# Patient Record
Sex: Female | Born: 1975 | Race: Black or African American | Hispanic: No | Marital: Single | Smoking: Never smoker
Health system: Southern US, Community
[De-identification: ages and names within clinical notes are randomized; demographics above are authoritative.]

## PROBLEM LIST (undated history)

## (undated) DIAGNOSIS — G43909 Migraine, unspecified, not intractable, without status migrainosus: Secondary | ICD-10-CM

## (undated) DIAGNOSIS — I10 Essential (primary) hypertension: Secondary | ICD-10-CM

## (undated) HISTORY — PX: TUBAL LIGATION: SHX77

---

## 2008-11-24 ENCOUNTER — Emergency Department (HOSPITAL_BASED_OUTPATIENT_CLINIC_OR_DEPARTMENT_OTHER): Admission: EM | Admit: 2008-11-24 | Discharge: 2008-11-24 | Payer: Self-pay | Admitting: Emergency Medicine

## 2008-11-24 ENCOUNTER — Ambulatory Visit: Payer: Self-pay | Admitting: Diagnostic Radiology

## 2012-04-04 ENCOUNTER — Emergency Department (HOSPITAL_BASED_OUTPATIENT_CLINIC_OR_DEPARTMENT_OTHER)
Admission: EM | Admit: 2012-04-04 | Discharge: 2012-04-04 | Disposition: A | Discharge status: Home / Self Care | Payer: Self-pay | Attending: Emergency Medicine | Admitting: Emergency Medicine

## 2012-04-04 ENCOUNTER — Other Ambulatory Visit: Payer: Self-pay

## 2012-04-04 ENCOUNTER — Encounter (HOSPITAL_BASED_OUTPATIENT_CLINIC_OR_DEPARTMENT_OTHER): Payer: Self-pay | Admitting: *Deleted

## 2012-04-04 ENCOUNTER — Emergency Department (HOSPITAL_BASED_OUTPATIENT_CLINIC_OR_DEPARTMENT_OTHER): Payer: Self-pay

## 2012-04-04 DIAGNOSIS — I10 Essential (primary) hypertension: Secondary | ICD-10-CM | POA: Insufficient documentation

## 2012-04-04 DIAGNOSIS — R0602 Shortness of breath: Secondary | ICD-10-CM | POA: Insufficient documentation

## 2012-04-04 DIAGNOSIS — E876 Hypokalemia: Secondary | ICD-10-CM | POA: Insufficient documentation

## 2012-04-04 DIAGNOSIS — M79609 Pain in unspecified limb: Secondary | ICD-10-CM | POA: Insufficient documentation

## 2012-04-04 HISTORY — DX: Essential (primary) hypertension: I10

## 2012-04-04 LAB — CBC WITH DIFFERENTIAL/PLATELET
Basophils Absolute: 0 10*3/uL (ref 0.0–0.1)
Basophils Relative: 1 % (ref 0–1)
HCT: 41 % (ref 36.0–46.0)
Hemoglobin: 13.8 g/dL (ref 12.0–15.0)
Lymphocytes Relative: 41 % (ref 12–46)
MCHC: 33.7 g/dL (ref 30.0–36.0)
Monocytes Absolute: 0.6 10*3/uL (ref 0.1–1.0)
Monocytes Relative: 8 % (ref 3–12)
Neutro Abs: 3.6 10*3/uL (ref 1.7–7.7)
Neutrophils Relative %: 50 % (ref 43–77)
WBC: 7.3 10*3/uL (ref 4.0–10.5)

## 2012-04-04 LAB — URINALYSIS, ROUTINE W REFLEX MICROSCOPIC
Bilirubin Urine: NEGATIVE
Hgb urine dipstick: NEGATIVE
Ketones, ur: NEGATIVE mg/dL
Specific Gravity, Urine: 1.019 (ref 1.005–1.030)
pH: 7.5 (ref 5.0–8.0)

## 2012-04-04 LAB — COMPREHENSIVE METABOLIC PANEL
AST: 20 U/L (ref 0–37)
Albumin: 4.9 g/dL (ref 3.5–5.2)
Alkaline Phosphatase: 65 U/L (ref 39–117)
BUN: 13 mg/dL (ref 6–23)
CO2: 29 mEq/L (ref 19–32)
Chloride: 100 mEq/L (ref 96–112)
GFR calc non Af Amer: >90 mL/min (ref >90)
Potassium: 3.1 mEq/L — ABNORMAL LOW (ref 3.5–5.1)
Total Bilirubin: 0.2 mg/dL — ABNORMAL LOW (ref 0.3–1.2)

## 2012-04-04 MED ORDER — POTASSIUM CHLORIDE CRYS ER 20 MEQ PO TBCR
40.0000 meq | EXTENDED_RELEASE_TABLET | Freq: Once | ORAL | Status: AC
  Administered 2012-04-04: 40 meq via ORAL
  Filled 2012-04-04: qty 2

## 2012-04-04 MED ORDER — LORAZEPAM 2 MG/ML IJ SOLN
1.0000 mg | Freq: Once | INTRAMUSCULAR | Status: AC
  Administered 2012-04-04: 1 mg via INTRAVENOUS
  Filled 2012-04-04: qty 1

## 2012-04-04 MED ORDER — SODIUM CHLORIDE 0.9 % IV BOLUS (SEPSIS)
1000.0000 mL | Freq: Once | INTRAVENOUS | Status: AC
  Administered 2012-04-04: 1000 mL via INTRAVENOUS

## 2012-04-04 NOTE — ED Notes (Signed)
Pt c/o left arm pain and SOB x 1 month

## 2012-04-04 NOTE — ED Provider Notes (Signed)
History    This chart was scribed for Jacqueline Munch, MD, MD by Smitty Pluck. The patient was seen in room MH10 and the patient's care was started at 4:20PM.   CSN: [id number]  Arrival date & time 04/04/12  1447   First MD Initiated Contact with Patient 04/04/12 1605      Chief Complaint  Patient presents with  . Shortness of Breath  . Arm Pain    (Consider location/radiation/quality/duration/timing/severity/associated sxs/prior treatment) The history is provided by the patient.   Jacqueline Valencia is a 36 y.o. female who presents to the Emergency Department complaining of intermittent, moderate left forearm pain radiating to left chest then to right chest and SOB onset 1 month ago. Pt reports having hx of HTN and migraines. Symptoms have increased since onset. Pt reports pain in arm is sharp. She noted that she had black discoloration on lower left arm before pain started. Denies visual changes and lack of coordination. Denies hx of diabetes, MI and CVA. Denies drinking alcohol and smoking cigarettes. Pt reports taking tylenol without relief. Not sure of any aggravating factors. Reports having tubal ligation.   Past Medical History  Diagnosis Date  . Hypertension     History reviewed. No pertinent past surgical history.  History reviewed. No pertinent family history.  History  Substance Use Topics  . Smoking status: Never Smoker   . Smokeless tobacco: Not on file  . Alcohol Use: No    OB History    Grav Para Term Preterm Abortions TAB SAB Ect Mult Living                  Review of Systems  Constitutional:       HPI  HENT:       HPI otherwise negative  Eyes: Negative.   Respiratory:       HPI, otherwise negative  Cardiovascular:       HPI, otherwise nmegative  Gastrointestinal: Negative for vomiting.  Genitourinary: Negative.        HPI, otherwise negative  Musculoskeletal:       HPI, otherwise negative  Skin: Negative.   Neurological: Negative for syncope.     Allergies  Asa  Home Medications   Current Outpatient Rx  Name Route Sig Dispense Refill  . ACETAMINOPHEN 325 MG PO TABS Oral Take by mouth every 6 (six) hours as needed. For migraines.    . IBUPROFEN 200 MG PO TABS Oral Take 400 mg by mouth every 6 (six) hours as needed. For headache.    Marland Kitchen METOPROLOL SUCCINATE ER 50 MG PO TB24 Oral Take 50 mg by mouth daily. Take with or immediately following a meal.      BP 190/100  Pulse 62  Temp 97.9 F (36.6 C) (Oral)  Resp 16  Ht 5' (1.524 m)  Wt 109 lb (49.442 kg)  BMI 21.29 kg/m2  SpO2 100%  LMP 03/25/2012  Physical Exam  Nursing note and vitals reviewed. Constitutional: She is oriented to person, place, and time. She appears well-developed and well-nourished.  HENT:  Head: Normocephalic and atraumatic.  Eyes: Conjunctivae and EOM are normal. Pupils are equal, round, and reactive to light.  Neck: Normal range of motion. Neck supple.  Cardiovascular: Normal rate and regular rhythm.        Notable click at end of heart rhythm  Good distal pulses     Pulmonary/Chest: Effort normal and breath sounds normal.  Abdominal: Soft. Bowel sounds are normal.  Musculoskeletal: Normal range of motion.  Mid anterior left forearm pain  No deformity noted of left forearm  Bilateral shoulders unremarkable   Neurological: She is alert and oriented to person, place, and time.  Skin: Skin is warm and dry.  Psychiatric: She has a normal mood and affect.    ED Course  Procedures (including critical care time) DIAGNOSTIC STUDIES: Oxygen Saturation is 100% on room air, normal by my interpretation.    COORDINATION OF CARE: 4:25PM EDP discusses pt ED treatment with pt  4:30PM EDP ordered medication:  Scheduled Meds:    . LORazepam  1 mg Intravenous Once  . sodium chloride  1,000 mL Intravenous Once   Continuous Infusions:  PRN Meds:.  5:50PM EDP rechecks pt. EDP is going to get EKG. 6:09PM EDP rechecks pt and discusses  lab/imaging results with pt.    Labs Reviewed  COMPREHENSIVE METABOLIC PANEL - Abnormal; Notable for the following:    Potassium 3.1 (*)     Total Protein 8.5 (*)     Total Bilirubin 0.2 (*)     All other components within normal limits  URINALYSIS, ROUTINE W REFLEX MICROSCOPIC - Abnormal; Notable for the following:    APPearance CLOUDY (*)     All other components within normal limits  CBC WITH DIFFERENTIAL  LIPASE, BLOOD  TROPONIN I   Dg Chest 2 View  04/04/2012  *RADIOLOGY REPORT*  Clinical Data: Chest pain.  CHEST - 2 VIEW  Comparison: 11/24/2008  Findings: Heart and mediastinal contours are within normal limits. No focal opacities or effusions.  No acute bony abnormality.  IMPRESSION: No active cardiopulmonary disease.  Original Report Authenticated By: Cyndie Chime, M.D.     No diagnosis found.    Date: 04/04/2012  Rate: 56  Rhythm: normal sinus rhythm and sinus arrhythmia  QRS Axis: normal  Intervals: normal  ST/T Wave abnormalities: normal  Conduction Disutrbances:none  Narrative Interpretation:   Old EKG Reviewed: none available Borderline ecg  Re-eval: patient states that she is tired.  No additional pain. MDM  I personally performed the services described in this documentation, which was scribed in my presence. The recorded information has been reviewed and considered.  This young female presents with one month of pain in her left arm, chest, with dyspnea.  On exam the patient is in no distress, she is minor tenderness to palpation about the left forearm.  She is not hypoxic, tachycardic, tachypneic.  Knees normal vital signs with the patient's denial of any smoking, oral contraceptive use and interpreted negative, which is reassuring for the already low suspicion of acute thromboembolic process.  The patient has no cardiovascular risk factors beyond hypertension.  The patient's laboratory evaluation was largely reassuring, though she was hypokalemic.  Hypokalemia  may be contributory towards her sharp muscle pain.  She was replenished, and discharged following a long discussion on the need for primary care followup, and return precautions  Jacqueline Munch, MD 04/04/12 1907

## 2012-11-01 ENCOUNTER — Encounter (HOSPITAL_BASED_OUTPATIENT_CLINIC_OR_DEPARTMENT_OTHER): Payer: Self-pay | Admitting: *Deleted

## 2012-11-01 ENCOUNTER — Emergency Department (HOSPITAL_BASED_OUTPATIENT_CLINIC_OR_DEPARTMENT_OTHER)
Admission: EM | Admit: 2012-11-01 | Discharge: 2012-11-01 | Disposition: A | Discharge status: Home / Self Care | Payer: Self-pay | Attending: Emergency Medicine | Admitting: Emergency Medicine

## 2012-11-01 DIAGNOSIS — M26629 Arthralgia of temporomandibular joint, unspecified side: Secondary | ICD-10-CM

## 2012-11-01 DIAGNOSIS — I10 Essential (primary) hypertension: Secondary | ICD-10-CM | POA: Insufficient documentation

## 2012-11-01 DIAGNOSIS — M26609 Unspecified temporomandibular joint disorder, unspecified side: Secondary | ICD-10-CM | POA: Insufficient documentation

## 2012-11-01 DIAGNOSIS — Z79899 Other long term (current) drug therapy: Secondary | ICD-10-CM | POA: Insufficient documentation

## 2012-11-01 DIAGNOSIS — M62838 Other muscle spasm: Secondary | ICD-10-CM | POA: Insufficient documentation

## 2012-11-01 DIAGNOSIS — H9210 Otorrhea, unspecified ear: Secondary | ICD-10-CM | POA: Insufficient documentation

## 2012-11-01 MED ORDER — DIAZEPAM 5 MG PO TABS: 5.0000 mg | ORAL_TABLET | Freq: Three times a day (TID) | ORAL | Status: DC | PRN

## 2012-11-01 MED ORDER — HYDROCODONE-ACETAMINOPHEN 5-325 MG PO TABS: 1.0000 | ORAL_TABLET | Freq: Four times a day (QID) | ORAL | Status: DC | PRN

## 2012-11-01 NOTE — ED Notes (Signed)
Pt states her mother was sick and believes she caught it she states she has had swollen lymph nodes underneath both sides of jaw for a week a mild pain in right ear occasionally denies sore throat nausea vomiting fever or chills

## 2012-11-01 NOTE — ED Provider Notes (Signed)
History     CSN: [id number]  Arrival date & time 11/01/12  5366   First MD Initiated Contact with Patient 11/01/12 1028      Chief Complaint  Patient presents with  . Adenopathy    (Consider location/radiation/quality/duration/timing/severity/associated sxs/prior treatment) HPI Is a 37 year old female whose mother had a viral illness about a week ago. Since that time she has had pain in her right ear which she attributed to lymphadenopathy from presumably catching the same virus. The pain is specifically located in the temporomandibular joints bilaterally, right worse than left. The pain in the right radiates to the right ear. There is moderate to severe pain especially with palpation or eating. She states eating is very difficult due to the pain. She's not had any fever, nausea, vomiting, diarrhea or sore throat.  Past Medical History  Diagnosis Date  . Hypertension     Past Surgical History  Procedure Laterality Date  . Tubal ligation      History reviewed. No pertinent family history.  History  Substance Use Topics  . Smoking status: Never Smoker   . Smokeless tobacco: Not on file  . Alcohol Use: No    OB History   Grav Para Term Preterm Abortions TAB SAB Ect Mult Living                  Review of Systems  All other systems reviewed and are negative.    Allergies  Asa  Home Medications   Current Outpatient Rx  Name  Route  Sig  Dispense  Refill  . acetaminophen (TYLENOL) 325 MG tablet   Oral   Take by mouth every 6 (six) hours as needed. For migraines.         Marland Kitchen ibuprofen (ADVIL,MOTRIN) 200 MG tablet   Oral   Take 400 mg by mouth every 6 (six) hours as needed. For headache.         . metoprolol succinate (TOPROL-XL) 50 MG 24 hr tablet   Oral   Take 50 mg by mouth daily. Take with or immediately following a meal.           BP 167/97  Pulse 87  Temp(Src) 98.2 F (36.8 C) (Oral)  Resp 16  Ht 5\' 1"  (1.549 m)  Wt 115 lb (52.164 kg)  BMI  21.74 kg/m2  SpO2 100%  LMP 10/28/2012  Physical Exam General: Well-developed, well-nourished female in no acute distress; appearance consistent with age of record HENT: normocephalic, atraumatic; TMJ joint pain right greater than left with clicking of the right TMJ on opening and closing of the mouth; no pharyngeal erythema or exudate Eyes: pupils equal round and reactive to light; extraocular muscles intact Neck: supple; no lymphadenopathy Heart: regular rate and rhythm Lungs: Normal respiratory effort and excursion Abdomen: soft; nondistended Extremities: No deformity; full range of motion; pulses normal Neurologic: Awake, alert and oriented; motor function intact in all extremities and symmetric; no facial droop Skin: Warm and dry Psychiatric: Normal mood and affect    ED Course  Procedures (including critical care time)     MDM  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 We will refer her to dentistry. She was advised to use soft foods. We will treat her for muscle spasm and pain the        Hanley Seamen, MD 11/01/12 1037

## 2012-11-14 ENCOUNTER — Emergency Department (HOSPITAL_BASED_OUTPATIENT_CLINIC_OR_DEPARTMENT_OTHER): Payer: Self-pay

## 2012-11-14 ENCOUNTER — Encounter (HOSPITAL_BASED_OUTPATIENT_CLINIC_OR_DEPARTMENT_OTHER): Payer: Self-pay

## 2012-11-14 ENCOUNTER — Emergency Department (HOSPITAL_BASED_OUTPATIENT_CLINIC_OR_DEPARTMENT_OTHER)
Admission: EM | Admit: 2012-11-14 | Discharge: 2012-11-14 | Disposition: A | Discharge status: Home / Self Care | Payer: Self-pay | Attending: Emergency Medicine | Admitting: Emergency Medicine

## 2012-11-14 DIAGNOSIS — H9209 Otalgia, unspecified ear: Secondary | ICD-10-CM | POA: Insufficient documentation

## 2012-11-14 DIAGNOSIS — I10 Essential (primary) hypertension: Secondary | ICD-10-CM | POA: Insufficient documentation

## 2012-11-14 DIAGNOSIS — Z79899 Other long term (current) drug therapy: Secondary | ICD-10-CM | POA: Insufficient documentation

## 2012-11-14 DIAGNOSIS — M542 Cervicalgia: Secondary | ICD-10-CM | POA: Insufficient documentation

## 2012-11-14 DIAGNOSIS — R220 Localized swelling, mass and lump, head: Secondary | ICD-10-CM | POA: Insufficient documentation

## 2012-11-14 DIAGNOSIS — M26629 Arthralgia of temporomandibular joint, unspecified side: Secondary | ICD-10-CM | POA: Insufficient documentation

## 2012-11-14 DIAGNOSIS — J029 Acute pharyngitis, unspecified: Secondary | ICD-10-CM | POA: Insufficient documentation

## 2012-11-14 DIAGNOSIS — M2669 Other specified disorders of temporomandibular joint: Secondary | ICD-10-CM

## 2012-11-14 DIAGNOSIS — R1310 Dysphagia, unspecified: Secondary | ICD-10-CM | POA: Insufficient documentation

## 2012-11-14 DIAGNOSIS — J189 Pneumonia, unspecified organism: Secondary | ICD-10-CM | POA: Insufficient documentation

## 2012-11-14 LAB — CBC WITH DIFFERENTIAL/PLATELET
Eosinophils Absolute: 0.1 10*3/uL (ref 0.0–0.7)
Eosinophils Relative: 1 % (ref 0–5)
HCT: 35.5 % — ABNORMAL LOW (ref 36.0–46.0)
Hemoglobin: 13 g/dL (ref 12.0–15.0)
Lymphs Abs: 2.1 10*3/uL (ref 0.7–4.0)
MCH: 30.2 pg (ref 26.0–34.0)
MCV: 82.4 fL (ref 78.0–100.0)
Monocytes Absolute: 0.8 10*3/uL (ref 0.1–1.0)
Monocytes Relative: 8 % (ref 3–12)
RBC: 4.31 MIL/uL (ref 3.87–5.11)

## 2012-11-14 LAB — COMPREHENSIVE METABOLIC PANEL
Alkaline Phosphatase: 69 U/L (ref 39–117)
BUN: 11 mg/dL (ref 6–23)
Creatinine, Ser: 0.7 mg/dL (ref 0.50–1.10)
GFR calc Af Amer: >90 mL/min (ref >90)
Glucose, Bld: 114 mg/dL — ABNORMAL HIGH (ref 70–99)
Potassium: 3.3 mEq/L — ABNORMAL LOW (ref 3.5–5.1)
Total Protein: 8.1 g/dL (ref 6.0–8.3)

## 2012-11-14 MED ORDER — SODIUM CHLORIDE 0.9 % IV SOLN: INTRAVENOUS | Status: DC

## 2012-11-14 MED ORDER — LEVOFLOXACIN 500 MG PO TABS
500.0000 mg | ORAL_TABLET | Freq: Once | ORAL | Status: AC
  Administered 2012-11-14: 500 mg via ORAL
  Filled 2012-11-14: qty 1

## 2012-11-14 MED ORDER — ONDANSETRON HCL 4 MG/2ML IJ SOLN
4.0000 mg | Freq: Once | INTRAMUSCULAR | Status: AC
Start: 2012-11-14 — End: 2012-11-14
  Administered 2012-11-14: 4 mg via INTRAVENOUS

## 2012-11-14 MED ORDER — LEVOFLOXACIN 500 MG PO TABS: 500.0000 mg | ORAL_TABLET | Freq: Every day | ORAL | Status: DC

## 2012-11-14 MED ORDER — HYDROMORPHONE HCL PF 1 MG/ML IJ SOLN
1.0000 mg | Freq: Once | INTRAMUSCULAR | Status: AC
  Administered 2012-11-14: 1 mg via INTRAVENOUS
  Filled 2012-11-14: qty 1

## 2012-11-14 MED ORDER — DIPHENHYDRAMINE HCL 25 MG PO CAPS
ORAL_CAPSULE | ORAL | Status: AC
  Administered 2012-11-14: 25 mg via ORAL
  Filled 2012-11-14: qty 1

## 2012-11-14 MED ORDER — IOHEXOL 300 MG/ML  SOLN
80.0000 mL | Freq: Once | INTRAMUSCULAR | Status: AC | PRN
  Administered 2012-11-14: 80 mL via INTRAVENOUS

## 2012-11-14 MED ORDER — KETOROLAC TROMETHAMINE 60 MG/2ML IM SOLN
60.0000 mg | Freq: Once | INTRAMUSCULAR | Status: AC
  Administered 2012-11-14: 60 mg via INTRAMUSCULAR
  Filled 2012-11-14: qty 2

## 2012-11-14 MED ORDER — SODIUM CHLORIDE 0.9 % IV BOLUS (SEPSIS)
1000.0000 mL | Freq: Once | INTRAVENOUS | Status: AC
  Administered 2012-11-14: 1000 mL via INTRAVENOUS

## 2012-11-14 MED ORDER — HYDROMORPHONE HCL PF 1 MG/ML IJ SOLN
1.0000 mg | Freq: Once | INTRAMUSCULAR | Status: AC
  Administered 2012-11-14: 1 mg via INTRAMUSCULAR
  Filled 2012-11-14: qty 1

## 2012-11-14 MED ORDER — ONDANSETRON HCL 4 MG/2ML IJ SOLN
INTRAMUSCULAR | Status: AC
  Filled 2012-11-14: qty 2

## 2012-11-14 MED ORDER — OXYCODONE-ACETAMINOPHEN 5-325 MG PO TABS: 1.0000 | ORAL_TABLET | ORAL | Status: DC | PRN

## 2012-11-14 MED ORDER — DIPHENHYDRAMINE HCL 25 MG PO CAPS: 25.0000 mg | ORAL_CAPSULE | Freq: Once | ORAL | Status: AC

## 2012-11-14 MED ORDER — METOPROLOL TARTRATE 100 MG PO TABS: 50.0000 mg | ORAL_TABLET | Freq: Two times a day (BID) | ORAL | Status: AC

## 2012-11-14 MED ORDER — IOHEXOL 300 MG/ML  SOLN
100.0000 mL | Freq: Once | INTRAMUSCULAR | Status: AC | PRN
  Administered 2012-11-14: 75 mL via INTRAVENOUS

## 2012-11-14 MED ORDER — METOPROLOL TARTRATE 50 MG PO TABS
50.0000 mg | ORAL_TABLET | Freq: Once | ORAL | Status: AC
  Administered 2012-11-14: 50 mg via ORAL
  Filled 2012-11-14: qty 1

## 2012-11-14 NOTE — ED Provider Notes (Signed)
History     CSN: [id number]  Arrival date & time 11/14/12  1418   First MD Initiated Contact with Patient 11/14/12 1548      Chief Complaint  Patient presents with  . Temporomandibular Joint Pain    (Consider location/radiation/quality/duration/timing/severity/associated sxs/prior treatment) HPI Jacqueline Valencia is a 37 y.o. female who presents to ED with complaint of bilateral jaw pain and facial swelling. States started about a week ago. States was seen here in ER, was told it is most likely TMJ. States was started on vicodin and valium. Per pt, pain is worsening. Now unable to open mouth. Unable to eat, drink. Tried warm compresses. States bilateral facial swelling around jaw and ear areas. Swollen tender submandibular lymph nodes. States sore throat but only on the "sides" and having hard time swallowing. States all of the teeth are sore. Bilateral ear pain. Denies fever, chills. No injuries. No other complaints.    Past Medical History  Diagnosis Date  . Hypertension     Past Surgical History  Procedure Laterality Date  . Tubal ligation      No family history on file.  History  Substance Use Topics  . Smoking status: Never Smoker   . Smokeless tobacco: Not on file  . Alcohol Use: No    OB History   Grav Para Term Preterm Abortions TAB SAB Ect Mult Living                  Review of Systems  Constitutional: Negative for fever and chills.  HENT: Positive for ear pain, sore throat, facial swelling, trouble swallowing and neck pain. Negative for congestion.   Respiratory: Negative.   Cardiovascular: Negative.   Genitourinary: Negative for dysuria.  Musculoskeletal: Negative for myalgias.  Skin: Negative.     Allergies  Asa  Home Medications   Current Outpatient Rx  Name  Route  Sig  Dispense  Refill  . acetaminophen (TYLENOL) 325 MG tablet   Oral   Take by mouth every 6 (six) hours as needed. For migraines.         . diazepam (VALIUM) 5 MG tablet    Oral   Take 1 tablet (5 mg total) by mouth every 8 (eight) hours as needed (jaw spasms).   20 tablet   0   . HYDROcodone-acetaminophen (NORCO/VICODIN) 5-325 MG per tablet   Oral   Take 1-2 tablets by mouth every 6 (six) hours as needed for pain.   20 tablet   0   . ibuprofen (ADVIL,MOTRIN) 200 MG tablet   Oral   Take 400 mg by mouth every 6 (six) hours as needed. For headache.         . metoprolol succinate (TOPROL-XL) 50 MG 24 hr tablet   Oral   Take 50 mg by mouth daily. Take with or immediately following a meal.           BP 174/111  Pulse 100  Temp(Src) 98.7 F (37.1 C) (Oral)  Resp 18  SpO2 98%  LMP 10/28/2012  Physical Exam  Nursing note and vitals reviewed. Constitutional: She appears well-developed and well-nourished.  Crying, appears to be in pain  HENT:  Head: Normocephalic and atraumatic.  Right Ear: External ear normal.  Left Ear: External ear normal.  Nose: Nose normal.  Trismus, only able to open mouth about 72finger width. Pharynx hard to examine, but appears non edematous. TMs normal bilaterally. Tender to palpation over preauricular area, TMJs, and over bilateral mandible. No posterior mastoid  tenderness. Tender submandibular lymphadenopathy.   Neck: Neck supple.  Skin: Skin is warm and dry.    ED Course  Procedures (including critical care time)  Pt with bilateral TMJ and mandibular pain, states swelling and swollen lymph nodes. Trismus present. States difficult swallowing. Will get CT neck  Results for orders placed during the hospital encounter of 11/14/12  CBC WITH DIFFERENTIAL      Result Value Range   WBC 9.3  4.0 - 10.5 K/uL   RBC 4.31  3.87 - 5.11 MIL/uL   Hemoglobin 13.0  12.0 - 15.0 g/dL   HCT 30.8 (*) 65.7 - 84.6 %   MCV 82.4  78.0 - 100.0 fL   MCH 30.2  26.0 - 34.0 pg   MCHC 36.6 (*) 30.0 - 36.0 g/dL   RDW 96.2  95.2 - 84.1 %   Platelets 235  150 - 400 K/uL   Neutrophils Relative 69  43 - 77 %   Neutro Abs 6.4  1.7 - 7.7  K/uL   Lymphocytes Relative 22  12 - 46 %   Lymphs Abs 2.1  0.7 - 4.0 K/uL   Monocytes Relative 8  3 - 12 %   Monocytes Absolute 0.8  0.1 - 1.0 K/uL   Eosinophils Relative 1  0 - 5 %   Eosinophils Absolute 0.1  0.0 - 0.7 K/uL   Basophils Relative 0  0 - 1 %   Basophils Absolute 0.0  0.0 - 0.1 K/uL  COMPREHENSIVE METABOLIC PANEL      Result Value Range   Sodium 137  135 - 145 mEq/L   Potassium 3.3 (*) 3.5 - 5.1 mEq/L   Chloride 98  96 - 112 mEq/L   CO2 28  19 - 32 mEq/L   Glucose, Bld 114 (*) 70 - 99 mg/dL   BUN 11  6 - 23 mg/dL   Creatinine, Ser 3.24  0.50 - 1.10 mg/dL   Calcium 9.2  8.4 - 40.1 mg/dL   Total Protein 8.1  6.0 - 8.3 g/dL   Albumin 4.4  3.5 - 5.2 g/dL   AST 21  0 - 37 U/L   ALT 12  0 - 35 U/L   Alkaline Phosphatase 69  39 - 117 U/L   Total Bilirubin 0.2 (*) 0.3 - 1.2 mg/dL   GFR calc non Af Amer >90  >90 mL/min   GFR calc Af Amer >90  >90 mL/min   Ct Soft Tissue Neck W Contrast  11/14/2012  *RADIOLOGY REPORT*  Clinical Data: Bilateral neck and TMJ pain for several weeks.  CT NECK WITH CONTRAST  Technique:  Multidetector CT imaging of the neck was performed with intravenous contrast.  Contrast: 75mL OMNIPAQUE IOHEXOL 300 MG/ML  SOLN  Comparison: None.  Findings: Rounded area of patchy opacity in the right upper lobe, not seen on the radiographs dated 04/04/2012.  This measures 3.1 x 2.9 cm on image number 39.  This extends more superiorly and medially, with a less rounded configuration.  The left lung apex is clear.  Normal appearing neck soft tissues with no masses, fluid collections or enlarged lymph nodes.  Minimal anterior spur formation at the C5-6 level.  IMPRESSION:  1.  Patchy opacity in the right upper lobe with an appearance most compatible with pneumonia.  If the patient does not have symptoms of pneumonia, then a chest CT with contrast would be recommended for more complete characterization, since this is not included in its entirety. 2.  Normal  appearing neck  and temporomandibular joints.   Original Report Authenticated By: Beckie Salts, M.D.     CT showed right upper lung mass. Will get CT chest.    Ct Chest W Contrast  11/14/2012  *RADIOLOGY REPORT*  Clinical Data: Patchy opacity in the right upper lobe identified on and neck CT performed earlier today.  CT CHEST WITH CONTRAST  Technique:  Multidetector CT imaging of the chest was performed following the standard protocol during bolus administration of intravenous contrast.  Contrast: 80mL OMNIPAQUE IOHEXOL 300 MG/ML  SOLN  Comparison: Neck CT 11/14/2012 and chest radiograph 04/04/2012  Findings: Normal thyroid gland.  Thoracic aorta is normal in caliber and enhancement.  Heart size is normal.  There is subcarinal lymphadenopathy measuring up to 15 mm short axis.  Left hilar lymph node measures 6.6 x 9.5 mm.  Right hilar nodal tissue measures up to 5 mm short axis.  There are discrete patchy rounded areas of airspace disease scattered in the right upper lobe, the right lower lobe, and the left upper lobe, with a very small patchy airspace opacity in the superior segment of the left lower lobe. No interstitial abnormality is identified.  Negative for pleural pericardial effusion.  Vertebral bodies are normal in height and alignment.  No acute or suspicious bony abnormality is identified.  The images of the upper abdomen demonstrate some high density within the collecting system of the upper pole of the left kidney. This could reflect excretion of contrast into the collecting system, or possibly a renal stone.  IMPRESSION:  1.  Discrete multifocal multilobar areas of airspace disease bilaterally.  Findings are most suggestive of pneumonia.  Consider short-term follow-up noncontrast chest CT in 2-3 months to evaluate for improvement/resolution. 2.  Subcarinal and mild bihilar lymphadenopathy is likely reactive. 3.  Cannot exclude an upper pole left renal stone (versus excretion of contrast into the renal collecting  system).  This area is incompletely imaged.   Original Report Authenticated By: Britta Mccreedy, M.D.       1. CAP (community acquired pneumonia)   2. TMJ inflammation   3. Hypertension       MDM  PT with negative CT of the soft tissue neck, normal TMs. Suspect TMJ. CT chest obtained due to finding on ct neck, and it showed multifocal multilobar pneumonia. Will start on levaquin, pain medications. PT is not short of breath. Oxygen sat 100% on RA. BP elevated. Has not taken her medications in few days. Will need refills. Given a dose here in ED, will d/c home with followup. Ibuprofen and percocet for pain.        Lottie Mussel, PA-C 11/14/12 2348  Lottie Mussel, PA-C 11/14/12 2349

## 2012-11-14 NOTE — ED Notes (Signed)
Bednar MD at bedside. 

## 2012-11-14 NOTE — ED Notes (Signed)
Pt states she was recently dx with TMJ-states meds are not working and pain is worse

## 2012-11-14 NOTE — ED Notes (Signed)
Pt given rx x 3 for percocet, levaquin and lopressor- d/c home with ride

## 2012-11-14 NOTE — ED Provider Notes (Signed)
Medical screening examination/treatment/procedure(s) were conducted as a shared visit with non-physician practitioner(s) and myself.  I personally evaluated the patient during the encounter.  Airway patent and maintained, she does have trismus but I am able to visualize the posterior pharynx appears normal, her dentition is nontender to percussion, gingiva are to be normal, her bilateral TMJs are quite tender and she has mild generalized mandibular tenderness or maxilla is nontender and she has no facial erythema induration or increased warmth, she has small palpable anterior cervical lymph nodes, she is no stridor and is able to swallow her secretions, she is no neck swelling or neck tenderness, she does not have a primary care Dr. for her incidental finding of the abnormal right upper lobe of the lung on the CT scan of her neck leads to an order for a CT scan of her chest to rule out mass since she has no symptoms to suggest pneumonia and had a normal chest x-ray within the last year.  Hurman Horn, MD 11/16/12 2229

## 2013-03-18 ENCOUNTER — Emergency Department (HOSPITAL_BASED_OUTPATIENT_CLINIC_OR_DEPARTMENT_OTHER)
Admission: EM | Admit: 2013-03-18 | Discharge: 2013-03-18 | Disposition: A | Discharge status: Home / Self Care | Payer: Self-pay | Attending: Emergency Medicine | Admitting: Emergency Medicine

## 2013-03-18 ENCOUNTER — Encounter (HOSPITAL_BASED_OUTPATIENT_CLINIC_OR_DEPARTMENT_OTHER): Payer: Self-pay

## 2013-03-18 DIAGNOSIS — L739 Follicular disorder, unspecified: Secondary | ICD-10-CM

## 2013-03-18 DIAGNOSIS — L738 Other specified follicular disorders: Secondary | ICD-10-CM | POA: Insufficient documentation

## 2013-03-18 DIAGNOSIS — I10 Essential (primary) hypertension: Secondary | ICD-10-CM

## 2013-03-18 DIAGNOSIS — L0291 Cutaneous abscess, unspecified: Secondary | ICD-10-CM | POA: Insufficient documentation

## 2013-03-18 DIAGNOSIS — R51 Headache: Secondary | ICD-10-CM | POA: Insufficient documentation

## 2013-03-18 MED ORDER — SULFAMETHOXAZOLE-TMP DS 800-160 MG PO TABS
1.0000 | ORAL_TABLET | Freq: Once | ORAL | Status: AC
  Administered 2013-03-18: 1 via ORAL
  Filled 2013-03-18: qty 1

## 2013-03-18 MED ORDER — ACETAMINOPHEN-CODEINE #3 300-30 MG PO TABS: 1.0000 | ORAL_TABLET | Freq: Four times a day (QID) | ORAL | Status: DC | PRN

## 2013-03-18 MED ORDER — METOPROLOL TARTRATE 50 MG PO TABS: 50.0000 mg | ORAL_TABLET | Freq: Two times a day (BID) | ORAL | Status: AC

## 2013-03-18 MED ORDER — SULFAMETHOXAZOLE-TRIMETHOPRIM 800-160 MG PO TABS: 1.0000 | ORAL_TABLET | Freq: Two times a day (BID) | ORAL | Status: DC

## 2013-03-18 NOTE — ED Notes (Signed)
Swelling to right upper eyelid that started this am

## 2013-03-18 NOTE — ED Provider Notes (Signed)
History    CSN: [id number] Arrival date & time 03/18/13  1007  First MD Initiated Contact with Patient 03/18/13 1045     Chief Complaint  Patient presents with  . Facial Swelling   (Consider location/radiation/quality/duration/timing/severity/associated sxs/prior Treatment) HPI Comments: Pt also reports has been out of BP meds for about 1 month.  No CP, SOB, no focal numbness or weakness, blurred vision.    Patient is a 37 y.o. female presenting with abscess. The history is provided by the patient.  Abscess Location:  Face Facial abscess location:  R eyebrow Size:  1 cm Abscess quality: painful, redness and warmth   Red streaking: no   Duration:  1 week Progression:  Worsening Pain details:    Quality:  Aching, sharp and throbbing   Severity:  Moderate   Timing:  Constant   Progression:  Waxing and waning Chronicity:  New Context: not skin injury   Relieved by:  NSAIDs, draining/squeezing and warm compresses Associated symptoms: headaches   Associated symptoms: no fever   Risk factors: prior abscess    Past Medical History  Diagnosis Date  . Hypertension    Past Surgical History  Procedure Laterality Date  . Tubal ligation     History reviewed. No pertinent family history. History  Substance Use Topics  . Smoking status: Never Smoker   . Smokeless tobacco: Not on file  . Alcohol Use: No   OB History   Grav Para Term Preterm Abortions TAB SAB Ect Mult Living                 Review of Systems  Constitutional: Negative for fever.  Neurological: Positive for headaches.    Allergies  Asa  Home Medications   Current Outpatient Rx  Name  Route  Sig  Dispense  Refill  . acetaminophen-codeine (TYLENOL #3) 300-30 MG per tablet   Oral   Take 1-2 tablets by mouth every 6 (six) hours as needed for pain.   15 tablet   0   . metoprolol (LOPRESSOR) 100 MG tablet   Oral   Take 0.5 tablets (50 mg total) by mouth 2 (two) times daily.   30 tablet   2   .  metoprolol (LOPRESSOR) 50 MG tablet   Oral   Take 1 tablet (50 mg total) by mouth 2 (two) times daily.   60 tablet   0   . sulfamethoxazole-trimethoprim (BACTRIM DS,SEPTRA DS) 800-160 MG per tablet   Oral   Take 1 tablet by mouth 2 (two) times daily.   20 tablet   0    BP 168/122  Pulse 85  Temp(Src) 98.3 F (36.8 C) (Oral)  Resp 16  Ht 5\' 2"  (1.575 m)  Wt 115 lb (52.164 kg)  BMI 21.03 kg/m2  SpO2 100%  LMP 02/19/2013 Physical Exam  Nursing note and vitals reviewed. Constitutional: She is oriented to person, place, and time. She appears well-developed and well-nourished.  HENT:  Head: Normocephalic and atraumatic.  Eyes: Conjunctivae and EOM are normal. Pupils are equal, round, and reactive to light. Right eye exhibits no chemosis. Left eye exhibits no chemosis.    Neck: Normal range of motion. Neck supple.  Pulmonary/Chest: Effort normal. No respiratory distress.  Abdominal: Soft. There is no tenderness.  Lymphadenopathy:    She has no cervical adenopathy.  Neurological: She is alert and oriented to person, place, and time. She exhibits normal muscle tone. Coordination normal.  Skin: Skin is warm and dry. No rash noted.  She is not diaphoretic.  Psychiatric: She has a normal mood and affect.    ED Course  Procedures (including critical care time) Labs Reviewed - No data to display No results found. 1. Folliculitis   2. Hypertension    ra sat is 100% and I interpret to be normal MDM  Will give Rx for metoprolol as refill to restart.  Will treat with bactrim and pt told to continue using warm compresses to see if swelling improves and will drain.  Gavin Pound. Kaidyn Hernandes, MD 03/18/13 1101

## 2013-03-18 NOTE — Discharge Instructions (Signed)
 Folliculitis  Folliculitis is redness, soreness, and swelling (inflammation) of the hair follicles. This condition can occur anywhere on the body. People with weakened immune systems, diabetes, or obesity have a greater risk of getting folliculitis. CAUSES  Bacterial infection. This is the most common cause.  Fungal infection.  Viral infection.  Contact with certain chemicals, especially oils and tars. Long-term folliculitis can result from bacteria that live in the nostrils. The bacteria may trigger multiple outbreaks of folliculitis over time. SYMPTOMS Folliculitis most commonly occurs on the scalp, thighs, legs, back, buttocks, and areas where hair is shaved frequently. An early sign of folliculitis is a small, white or yellow, pus-filled, itchy lesion (pustule). These lesions appear on a red, inflamed follicle. They are usually less than 0.2 inches (5 mm) wide. When there is an infection of the follicle that goes deeper, it becomes a boil or furuncle. A group of closely packed boils creates a larger lesion (carbuncle). Carbuncles tend to occur in hairy, sweaty areas of the body. DIAGNOSIS  Your caregiver can usually tell what is wrong by doing a physical exam. A sample may be taken from one of the lesions and tested in a lab. This can help determine what is causing your folliculitis. TREATMENT  Treatment may include:  Applying warm compresses to the affected areas.  Taking antibiotic medicines orally or applying them to the skin.  Draining the lesions if they contain a large amount of pus or fluid.  Laser hair removal for cases of long-lasting folliculitis. This helps to prevent regrowth of the hair. HOME CARE INSTRUCTIONS  Apply warm compresses to the affected areas as directed by your caregiver.  If antibiotics are prescribed, take them as directed. Finish them even if you start to feel better.  You may take over-the-counter medicines to relieve itching.  Do not shave  irritated skin.  Follow up with your caregiver as directed. SEEK IMMEDIATE MEDICAL CARE IF:   You have increasing redness, swelling, or pain in the affected area.  You have a fever. MAKE SURE YOU:  Understand these instructions.  Will watch your condition.  Will get help right away if you are not doing well or get worse. Document Released: 11/02/2001 Document Revised: 02/23/2012 Document Reviewed: 11/24/2011 [facility] Patient Information 2014 [place], [place].     Arterial Hypertension Arterial hypertension (high blood pressure) is a condition of elevated pressure in your blood vessels. Hypertension over a long period of time is a risk factor for strokes, heart attacks, and heart failure. It is also the leading cause of kidney (renal) failure.  CAUSES   In Adults -- Over 90% of all hypertension has no known cause. This is called essential or primary hypertension. In the other 10% of people with hypertension, the increase in blood pressure is caused by another disorder. This is called secondary hypertension. Important causes of secondary hypertension are:  Heavy alcohol use.  Obstructive sleep apnea.  Hyperaldosterosim (Conn's syndrome).  Steroid use.  Chronic kidney failure.  Hyperparathyroidism.  Medications.  Renal artery stenosis.  Pheochromocytoma.  Cushing's disease.  Coarctation of the aorta.  Scleroderma renal crisis.  Licorice (in excessive amounts).  Drugs (cocaine, methamphetamine). Your caregiver can explain any items above that apply to you.  In Children -- Secondary hypertension is more common and should always be considered.  Pregnancy -- Few women of childbearing age have high blood pressure. However, up to 10% of them develop hypertension of pregnancy. Generally, this will not harm the woman. It may be a sign  of 3 complications of pregnancy: preeclampsia, HELLP syndrome, and eclampsia. Follow up and control with medication is  necessary. SYMPTOMS   This condition normally does not produce any noticeable symptoms. It is usually found during a routine exam.  Malignant hypertension is a late problem of high blood pressure. It may have the following symptoms:  Headaches.  Blurred vision.  End-organ damage (this means your kidneys, heart, lungs, and other organs are being damaged).  Stressful situations can increase the blood pressure. If a person with normal blood pressure has their blood pressure go up while being seen by their caregiver, this is often termed white coat hypertension. Its importance is not known. It may be related with eventually developing hypertension or complications of hypertension.  Hypertension is often confused with mental tension, stress, and anxiety. DIAGNOSIS  The diagnosis is made by 3 separate blood pressure measurements. They are taken at least 1 week apart from each other. If there is organ damage from hypertension, the diagnosis may be made without repeat measurements. Hypertension is usually identified by having blood pressure readings:  Above 140/90 mmHg measured in both arms, at 3 separate times, over a couple weeks.  Over 130/80 mmHg should be considered a risk factor and may require treatment in patients with diabetes. Blood pressure readings over 120/80 mmHg are called pre-hypertension even in non-diabetic patients. To get a true blood pressure measurement, use the following guidelines. Be aware of the factors that can alter blood pressure readings.  Take measurements at least 1 hour after caffeine.  Take measurements 30 minutes after smoking and without any stress. This is another reason to quit smoking  it raises your blood pressure.  Use a proper cuff size. Ask your caregiver if you are not sure about your cuff size.  Most home blood pressure cuffs are automatic. They will measure systolic and diastolic pressures. The systolic pressure is the pressure reading at the  start of sounds. Diastolic pressure is the pressure at which the sounds disappear. If you are elderly, measure pressures in multiple postures. Try sitting, lying or standing.  Sit at rest for a minimum of 5 minutes before taking measurements.  You should not be on any medications like decongestants. These are found in many cold medications.  Record your blood pressure readings and review them with your caregiver. If you have hypertension:  Your caregiver may do tests to be sure you do not have secondary hypertension (see causes above).  Your caregiver may also look for signs of metabolic syndrome. This is also called Syndrome X or Insulin Resistance Syndrome. You may have this syndrome if you have type 2 diabetes, abdominal obesity, and abnormal blood lipids in addition to hypertension.  Your caregiver will take your medical and family history and perform a physical exam.  Diagnostic tests may include blood tests (for glucose, cholesterol, potassium, and kidney function), a urinalysis, or an EKG. Other tests may also be necessary depending on your condition. PREVENTION  There are important lifestyle issues that you can adopt to reduce your chance of developing hypertension:  Maintain a normal weight.  Limit the amount of salt (sodium) in your diet.  Exercise often.  Limit alcohol intake.  Get enough potassium in your diet. Discuss specific advice with your caregiver.  Follow a DASH diet (dietary approaches to stop hypertension). This diet is rich in fruits, vegetables, and low-fat dairy products, and avoids certain fats. PROGNOSIS  Essential hypertension cannot be cured. Lifestyle changes and medical treatment can lower blood  pressure and reduce complications. The prognosis of secondary hypertension depends on the underlying cause. Many people whose hypertension is controlled with medicine or lifestyle changes can live a normal, healthy life.  RISKS AND COMPLICATIONS  While high  blood pressure alone is not an illness, it often requires treatment due to its short- and long-term effects on many organs. Hypertension increases your risk for:  CVAs or strokes (cerebrovascular accident).  Heart failure due to chronically high blood pressure (hypertensive cardiomyopathy).  Heart attack (myocardial infarction).  Damage to the retina (hypertensive retinopathy).  Kidney failure (hypertensive nephropathy). Your caregiver can explain list items above that apply to you. Treatment of hypertension can significantly reduce the risk of complications. TREATMENT   For overweight patients, weight loss and regular exercise are recommended. Physical fitness lowers blood pressure.  Mild hypertension is usually treated with diet and exercise. A diet rich in fruits and vegetables, fat-free dairy products, and foods low in fat and salt (sodium) can help lower blood pressure. Decreasing salt intake decreases blood pressure in a 1/3 of people.  Stop smoking if you are a smoker. The steps above are highly effective in reducing blood pressure. While these actions are easy to suggest, they are difficult to achieve. Most patients with moderate or severe hypertension end up requiring medications to bring their blood pressure down to a normal level. There are several classes of medications for treatment. Blood pressure pills (antihypertensives) will lower blood pressure by their different actions. Lowering the blood pressure by 10 mmHg may decrease the risk of complications by as much as 25%. The goal of treatment is effective blood pressure control. This will reduce your risk for complications. Your caregiver will help you determine the best treatment for you according to your lifestyle. What is excellent treatment for one person, may not be for you. HOME CARE INSTRUCTIONS   Do not smoke.  Follow the lifestyle changes outlined in the Prevention section.  If you are on medications, follow the  directions carefully. Blood pressure medications must be taken as prescribed. Skipping doses reduces their benefit. It also puts you at risk for problems.  Follow up with your caregiver, as directed.  If you are asked to monitor your blood pressure at home, follow the guidelines in the Diagnosis section above. SEEK MEDICAL CARE IF:   You think you are having medication side effects.  You have recurrent headaches or lightheadedness.  You have swelling in your ankles.  You have trouble with your vision. SEEK IMMEDIATE MEDICAL CARE IF:   You have sudden onset of chest pain or pressure, difficulty breathing, or other symptoms of a heart attack.  You have a severe headache.  You have symptoms of a stroke (such as sudden weakness, difficulty speaking, difficulty walking). MAKE SURE YOU:   Understand these instructions.  Will watch your condition.  Will get help right away if you are not doing well or get worse. Document Released: 08/24/2005 Document Revised: 11/16/2011 Document Reviewed: 03/24/2007 [facility] Patient Information 2014 [place], [place].

## 2013-03-24 ENCOUNTER — Emergency Department (HOSPITAL_BASED_OUTPATIENT_CLINIC_OR_DEPARTMENT_OTHER): Payer: BC Managed Care – PPO

## 2013-03-24 ENCOUNTER — Emergency Department (HOSPITAL_BASED_OUTPATIENT_CLINIC_OR_DEPARTMENT_OTHER)
Admission: EM | Admit: 2013-03-24 | Discharge: 2013-03-24 | Disposition: A | Discharge status: Home / Self Care | Payer: BC Managed Care – PPO | Attending: Emergency Medicine | Admitting: Emergency Medicine

## 2013-03-24 ENCOUNTER — Encounter (HOSPITAL_BASED_OUTPATIENT_CLINIC_OR_DEPARTMENT_OTHER): Payer: Self-pay | Admitting: *Deleted

## 2013-03-24 DIAGNOSIS — J189 Pneumonia, unspecified organism: Secondary | ICD-10-CM

## 2013-03-24 DIAGNOSIS — R0602 Shortness of breath: Secondary | ICD-10-CM | POA: Insufficient documentation

## 2013-03-24 DIAGNOSIS — R05 Cough: Secondary | ICD-10-CM | POA: Insufficient documentation

## 2013-03-24 DIAGNOSIS — M549 Dorsalgia, unspecified: Secondary | ICD-10-CM | POA: Insufficient documentation

## 2013-03-24 DIAGNOSIS — Z8679 Personal history of other diseases of the circulatory system: Secondary | ICD-10-CM | POA: Insufficient documentation

## 2013-03-24 DIAGNOSIS — J159 Unspecified bacterial pneumonia: Secondary | ICD-10-CM | POA: Insufficient documentation

## 2013-03-24 DIAGNOSIS — R059 Cough, unspecified: Secondary | ICD-10-CM | POA: Insufficient documentation

## 2013-03-24 DIAGNOSIS — Z3202 Encounter for pregnancy test, result negative: Secondary | ICD-10-CM | POA: Insufficient documentation

## 2013-03-24 DIAGNOSIS — Z79899 Other long term (current) drug therapy: Secondary | ICD-10-CM | POA: Insufficient documentation

## 2013-03-24 DIAGNOSIS — I10 Essential (primary) hypertension: Secondary | ICD-10-CM | POA: Insufficient documentation

## 2013-03-24 HISTORY — DX: Migraine, unspecified, not intractable, without status migrainosus: G43.909

## 2013-03-24 LAB — CBC WITH DIFFERENTIAL/PLATELET
Basophils Absolute: 0 10*3/uL (ref 0.0–0.1)
Basophils Relative: 0 % (ref 0–1)
Hemoglobin: 13.3 g/dL (ref 12.0–15.0)
MCHC: 36.9 g/dL — ABNORMAL HIGH (ref 30.0–36.0)
Neutro Abs: 2.1 10*3/uL (ref 1.7–7.7)
Neutrophils Relative %: 43 % (ref 43–77)
RDW: 12.1 % (ref 11.5–15.5)

## 2013-03-24 LAB — URINE MICROSCOPIC-ADD ON

## 2013-03-24 LAB — URINALYSIS, ROUTINE W REFLEX MICROSCOPIC
Leukocytes, UA: NEGATIVE
Protein, ur: 30 mg/dL — AB
Urobilinogen, UA: 1 mg/dL (ref 0.0–1.0)

## 2013-03-24 LAB — BASIC METABOLIC PANEL
Chloride: 101 mEq/L (ref 96–112)
GFR calc Af Amer: 84 mL/min — ABNORMAL LOW (ref >90)
Potassium: 4 mEq/L (ref 3.5–5.1)
Sodium: 138 mEq/L (ref 135–145)

## 2013-03-24 LAB — PREGNANCY, URINE: Preg Test, Ur: NEGATIVE

## 2013-03-24 LAB — TROPONIN I: Troponin I: <0.3 ng/mL (ref <0.30)

## 2013-03-24 MED ORDER — IOHEXOL 350 MG/ML SOLN
100.0000 mL | Freq: Once | INTRAVENOUS | Status: AC | PRN
  Administered 2013-03-24: 100 mL via INTRAVENOUS

## 2013-03-24 MED ORDER — HYDROCODONE-ACETAMINOPHEN 5-325 MG PO TABS: 2.0000 | ORAL_TABLET | ORAL | Status: DC | PRN

## 2013-03-24 MED ORDER — LEVOFLOXACIN 750 MG PO TABS: 750.0000 mg | ORAL_TABLET | Freq: Every day | ORAL | Status: DC

## 2013-03-24 MED ORDER — ALBUTEROL SULFATE HFA 108 (90 BASE) MCG/ACT IN AERS: 2.0000 | INHALATION_SPRAY | RESPIRATORY_TRACT | Status: DC | PRN

## 2013-03-24 NOTE — ED Provider Notes (Signed)
History    CSN: [id number] Arrival date & time 03/24/13  0930  First MD Initiated Contact with Patient 03/24/13 [phone]     Chief Complaint  Patient presents with  . Fever   (Consider location/radiation/quality/duration/timing/severity/associated sxs/prior Treatment) HPI Comments: Patient presents to the ER for diarrhea should of fever and back pain. Patient reports that she was seen a week ago for an infected area on her face, started on antibiotics. The next day she started running high fevers and was seen at [facility] emergency department. Patient says she was told that she needed to be admitted, but declined. She got IV antibiotics and went home. Since then she has continued to run low-grade fevers and has pain up and down the center for spine, just around the right side of her back. It has slight cough, occasionally short of breath. No abdominal or pelvic pain. No anterior chest pain.  Patient is a 37 y.o. female presenting with fever.  Fever Associated symptoms: cough   Associated symptoms: no chest pain    Past Medical History  Diagnosis Date  . Hypertension   . Migraine    Past Surgical History  Procedure Laterality Date  . Tubal ligation     No family history on file. History  Substance Use Topics  . Smoking status: Never Smoker   . Smokeless tobacco: Not on file  . Alcohol Use: No   OB History   Grav Para Term Preterm Abortions TAB SAB Ect Mult Living                 Review of Systems  Constitutional: Positive for fever.  Respiratory: Positive for cough and shortness of breath.   Cardiovascular: Negative for chest pain.  Musculoskeletal: Positive for back pain.  All other systems reviewed and are negative.    Allergies  Asa  Home Medications   Current Outpatient Rx  Name  Route  Sig  Dispense  Refill  . acetaminophen-codeine (TYLENOL #3) 300-30 MG per tablet   Oral   Take 1-2 tablets by mouth every 6 (six) hours as needed for pain.   15  tablet   0   . metoprolol (LOPRESSOR) 100 MG tablet   Oral   Take 0.5 tablets (50 mg total) by mouth 2 (two) times daily.   30 tablet   2   . metoprolol (LOPRESSOR) 50 MG tablet   Oral   Take 1 tablet (50 mg total) by mouth 2 (two) times daily.   60 tablet   0   . sulfamethoxazole-trimethoprim (BACTRIM DS,SEPTRA DS) 800-160 MG per tablet   Oral   Take 1 tablet by mouth 2 (two) times daily.   20 tablet   0    BP 146/90  Temp(Src) 98.1 F (36.7 C) (Oral)  Resp 22  Ht 5\' 1"  (1.549 m)  Wt 113 lb (51.256 kg)  BMI 21.36 kg/m2  SpO2 100%  LMP 03/20/2013 Physical Exam  Constitutional: She is oriented to person, place, and time. She appears well-developed and well-nourished. No distress.  HENT:  Head: Normocephalic and atraumatic.  Right Ear: Hearing normal.  Left Ear: Hearing normal.  Nose: Nose normal.  Mouth/Throat: Oropharynx is clear and moist and mucous membranes are normal.  Eyes: Conjunctivae and EOM are normal. Pupils are equal, round, and reactive to light.  Neck: Normal range of motion. Neck supple.  Cardiovascular: Regular rhythm, S1 normal and S2 normal.  Exam reveals no gallop and no friction rub.   No  murmur heard. Pulmonary/Chest: Effort normal and breath sounds normal. No respiratory distress. She exhibits no tenderness.  Abdominal: Soft. Normal appearance and bowel sounds are normal. There is no hepatosplenomegaly. There is no tenderness. There is no rebound, no guarding, no tenderness at McBurney's point and negative Murphy's sign. No hernia.  Musculoskeletal: Normal range of motion.  Diffuse tenderness thoracic and lumbar region midline and right paraspinal area. No point tenderness.  Neurological: She is alert and oriented to person, place, and time. She has normal strength. No cranial nerve deficit or sensory deficit. Coordination normal. GCS eye subscore is 4. GCS verbal subscore is 5. GCS motor subscore is 6.  Skin: Skin is warm, dry and intact. No rash  noted. No cyanosis.  Psychiatric: She has a normal mood and affect. Her speech is normal and behavior is normal. Thought content normal.    ED Course  Procedures (including critical care time)  EKG:  Date: 03/24/2013  Rate: 65  Rhythm: normal sinus rhythm  QRS Axis: normal  Intervals: normal  ST/T Wave abnormalities: normal  Conduction Disutrbances:none  Narrative Interpretation:   Old EKG Reviewed: unchanged   Labs Reviewed  CBC WITH DIFFERENTIAL - Abnormal; Notable for the following:    MCHC 36.9 (*)    All other components within normal limits  BASIC METABOLIC PANEL - Abnormal; Notable for the following:    GFR calc non Af Amer 72 (*)    GFR calc Af Amer 84 (*)    All other components within normal limits  D-DIMER, QUANTITATIVE - Abnormal; Notable for the following:    D-Dimer, Quant 1.04 (*)    All other components within normal limits  URINALYSIS, ROUTINE W REFLEX MICROSCOPIC - Abnormal; Notable for the following:    Hgb urine dipstick SMALL (*)    Bilirubin Urine SMALL (*)    Protein, ur 30 (*)    All other components within normal limits  URINE MICROSCOPIC-ADD ON - Abnormal; Notable for the following:    Bacteria, UA FEW (*)    All other components within normal limits  CULTURE, BLOOD (ROUTINE X 2)  CULTURE, BLOOD (ROUTINE X 2)  TROPONIN I  PREGNANCY, URINE   Dg Chest 2 View  03/24/2013   *RADIOLOGY REPORT*  Clinical Data: Fever and body aches.  CHEST - 2 VIEW  Comparison: CT scan from 11/14/2012.  Chest x-ray from 04/04/2012.  Findings: The lungs are clear without focal consolidation, edema, effusion or pneumothorax.  Cardiopericardial silhouette is within normal limits for size.  Imaged bony structures of the thorax are intact.  IMPRESSION: Normal exam.   Original Report Authenticated By: Kennith Center, M.D.   Ct Angio Chest Pe W/cm &/or Wo Cm  03/24/2013   *RADIOLOGY REPORT*  Clinical Data: Back pain, right-sided lung pain, evaluate for pulmonary embolism  CT  ANGIOGRAPHY CHEST  Technique:  Multidetector CT imaging of the chest using the standard protocol during bolus administration of intravenous contrast. Multiplanar reconstructed images including MIPs were obtained and reviewed to evaluate the vascular anatomy.  Contrast: OMNIPAQUE IOHEXOL 350 MG/ML SOLN  Comparison: 11/14/2012; Chest radiograph - earlier same day  Vascular Findings:  There is adequate opacification of the pulmonary vascular system with the main pulmonary artery measuring 368 HU.  There are no discrete filling defects within the pulmonary arterial tree to suggest pulmonary embolism.  Normal caliber of the main pulmonary artery.  Normal heart size.  No pericardial effusion.  Normal caliber of the thoracic aorta.  Bovine configuration of the  aortic arch.  No definite periaortic stranding.  -----------------------------------------------------  Nonvascular findings:  Evaluation of the pulmonary parenchyma is degraded secondary to patient respiratory artifact.  There are minimal subtle areas of geographic ground-glass within the right upper (image 25) and left upper lobes (image 34, series six) at the location of previously identified more confluent ground-glass opacities on recent perform chest CT.  New areas of subtle ground-glass are now seen within the right lower lobe (image 54) and superior segment of the lingula (image 42, series 6).  No focal airspace opacities or discrete air bronchograms.  No pleural effusion or pneumothorax.  The central pulmonary airways are patent.  No interval resolution of previously noted mediastinal and hilar lymphadenopathy with index residual prominent prevascular lymph node measuring 6 mm in short axis diameter (image 30, series 4), not enlarged by CT criteria.  Shoddy bilateral axillary lymph nodes are not enlarged by CT criteria with index of left axillary node measuring 8 mm in diameter (image 26, series 4) and index right axillary lymph node measuring 7 mm  (image 32).  Limited evaluation of the upper abdomen is normal.  No acute or aggressive osseous abnormalities. Respiratory artifact obscures evaluation of the sternum.  IMPRESSION: 1.  Negative for pulmonary embolism. 2.  Residual areas of ill-defined subtle ground-glass within the bilateral upper lobes with new areas of similar appearing ground- glass within the right lower lobe and lingula.  Findings are nonspecific but could be seen in the setting of resolving and/or developing inflammation and/or atypical multifocal infection/bronchiolitis .  Clinical correlation is advised.  3.  Improved mediastinal and hilar lymphadenopathy suggests resolved infection/inflammation.   Original Report Authenticated By: Tacey Ruiz, MD   Diagnosis: Pneumonia  MDM  Patient presents to the ER for evaluation of persistent fever. She was seen recently and diagnosed with a skin infection. She has been on Bactrim and the skin infection has improved, but she is now still experiencing fever has had some pain in the back and right lung area. There is cough. Initial workup shows normal white blood cell count and the rest of her labs and urinalysis were unremarkable. CT scan did not show any acute findings. CT angiography was performed to rule out PE. No PE seen, but there are areas of multifocal infection, possible atypical pneumonia. Will add Levaquin to Bactrim. Followup with primary doctor in 2 weeks for recheck. Return if symptoms worsen.   Gilda Crease, MD 03/24/13 1251

## 2013-03-24 NOTE — ED Notes (Signed)
Patient states she was seen here six days ago for an infected pimple on her left eyebrow.  States she was treated with anitbiotics.  States the next day she developed a fever and was seen at [facility] ED.  States she was told she had an infection in her blood.  States she now has generalized body aches and intermittent fever.

## 2013-03-24 NOTE — ED Notes (Signed)
Patient transported to CT 

## 2013-03-24 NOTE — ED Notes (Signed)
MD at bedside. 

## 2013-03-30 LAB — CULTURE, BLOOD (ROUTINE X 2): Culture: NO GROWTH

## 2013-12-11 ENCOUNTER — Encounter (HOSPITAL_BASED_OUTPATIENT_CLINIC_OR_DEPARTMENT_OTHER): Payer: Self-pay | Admitting: Emergency Medicine

## 2013-12-11 ENCOUNTER — Emergency Department (HOSPITAL_BASED_OUTPATIENT_CLINIC_OR_DEPARTMENT_OTHER)
Admission: EM | Admit: 2013-12-11 | Discharge: 2013-12-11 | Disposition: A | Discharge status: Home / Self Care | Payer: BC Managed Care – PPO | Attending: Emergency Medicine | Admitting: Emergency Medicine

## 2013-12-11 DIAGNOSIS — L659 Nonscarring hair loss, unspecified: Secondary | ICD-10-CM | POA: Insufficient documentation

## 2013-12-11 DIAGNOSIS — I10 Essential (primary) hypertension: Secondary | ICD-10-CM | POA: Insufficient documentation

## 2013-12-11 DIAGNOSIS — Z79899 Other long term (current) drug therapy: Secondary | ICD-10-CM | POA: Insufficient documentation

## 2013-12-11 MED ORDER — KETOCONAZOLE 2 % EX SHAM: 1.0000 "application " | MEDICATED_SHAMPOO | CUTANEOUS | Status: DC

## 2013-12-11 NOTE — ED Provider Notes (Signed)
CSN: [id number]     Arrival date & time 12/11/13  1419 History  This chart was scribed for Jacqueline Lyons, MD by Smiley Houseman, ED Scribe. The patient was seen in room MH11/MH11. Patient's care was started at 4:44 PM.    Chief Complaint  Patient presents with  . Alopecia   The history is provided by the patient. No language interpreter was used.   HPI Comments: Jacqueline Valencia is a 38 y.o. female who presents to the Emergency Department complaining of persistent alopecia onset after she started taking Clonidine in October 2014.  Pt reports she was taking Clonidine for HTN.  She states her hair started to grow back after she stopped taking the medication, except for a specific location on her scalp.  Pt denies pain, irritation, and itching to the area without hair.  She denies trying different shampoos or medications to help with hair growth.  Pt states she decided to come in this evening, because she works in [employer] and wants to be sure it isn't infectious.    Past Medical History  Diagnosis Date  . Hypertension   . Migraine    Past Surgical History  Procedure Laterality Date  . Tubal ligation     No family history on file. History  Substance Use Topics  . Smoking status: Never Smoker   . Smokeless tobacco: Not on file  . Alcohol Use: No   OB History   Grav Para Term Preterm Abortions TAB SAB Ect Mult Living                 Review of Systems  A complete 10 system review of systems was obtained and all systems are negative except as noted in the HPI and PMH.   Allergies  Asa  Home Medications   Current Outpatient Rx  Name  Route  Sig  Dispense  Refill  . LISINOPRIL-HYDROCHLOROTHIAZIDE PO   Oral   Take by mouth.         Marland Kitchen acetaminophen-codeine (TYLENOL #3) 300-30 MG per tablet   Oral   Take 1-2 tablets by mouth every 6 (six) hours as needed for pain.   15 tablet   0   . albuterol (PROVENTIL HFA;VENTOLIN HFA) 108 (90 BASE) MCG/ACT inhaler    Inhalation   Inhale 2 puffs into the lungs every 4 (four) hours as needed for wheezing.   1 Inhaler   0   . HYDROcodone-acetaminophen (NORCO/VICODIN) 5-325 MG per tablet   Oral   Take 2 tablets by mouth every 4 (four) hours as needed for pain.   10 tablet   0   . levofloxacin (LEVAQUIN) 750 MG tablet   Oral   Take 1 tablet (750 mg total) by mouth daily. X 7 days   7 tablet   0   . metoprolol (LOPRESSOR) 100 MG tablet   Oral   Take 0.5 tablets (50 mg total) by mouth 2 (two) times daily.   30 tablet   2   . metoprolol (LOPRESSOR) 50 MG tablet   Oral   Take 1 tablet (50 mg total) by mouth 2 (two) times daily.   60 tablet   0   . sulfamethoxazole-trimethoprim (BACTRIM DS,SEPTRA DS) 800-160 MG per tablet   Oral   Take 1 tablet by mouth 2 (two) times daily.   20 tablet   0    Triage Vitals: BP 178/109  Pulse 62  Temp(Src) 98.3 F (36.8 C) (Oral)  Resp 16  Ht  5\' 1"  (1.549 m)  Wt 113 lb (51.256 kg)  BMI 21.36 kg/m2  SpO2 100%  LMP 11/26/2013  Physical Exam  Nursing note and vitals reviewed. Constitutional: She is oriented to person, place, and time. She appears well-developed and well-nourished. No distress.  HENT:  Head: Normocephalic and atraumatic.  Scalp: there is a 3 cm round area where the hair is shorter.  There is no definite scalp lesion or scaling.    Eyes: Conjunctivae and EOM are normal.  Neck: Neck supple. No tracheal deviation present.  Cardiovascular: Normal rate, regular rhythm and normal heart sounds.  Exam reveals no gallop and no friction rub.   No murmur heard. Pulmonary/Chest: Effort normal and breath sounds normal. No respiratory distress. She has no wheezes. She has no rales.  Musculoskeletal: Normal range of motion.  Neurological: She is alert and oriented to person, place, and time.  Skin: Skin is warm and dry. No rash noted.  Psychiatric: She has a normal mood and affect. Her behavior is normal. Judgment and thought content normal.     ED Course  Procedures (including critical care time) DIAGNOSTIC STUDIES: Oxygen Saturation is 100% on RA, normal by my interpretation.    COORDINATION OF CARE: 5:09 PM-Patient informed of current plan of treatment and evaluation and agrees with plan.    MDM   Final diagnoses:  None    Patient presents with Hair loss on one patch of the posterior scalp. This started after she took clonidine. She is no longer taking this over her hair has not grown back. I am uncertain as if there is some underlying fungal infection but we'll recommend ketoconazole shampoo and when necessary followup with dermatology if this does not help   I personally performed the services described in this documentation, which was scribed in my presence. The recorded information has been reviewed and is accurate.      Jacqueline Lyonsouglas Fany Cavanaugh, MD 12/11/13 2258

## 2013-12-11 NOTE — Discharge Instructions (Signed)
Ketoconazole shampoo as prescribed.  Followup with dermatology if your symptoms are not resolving and return to the ER if you develop any new or concerning symptoms.

## 2013-12-11 NOTE — ED Notes (Signed)
Pt states her hair fell out in Oct 2014 r/t BP med clonidine-she is concerned with area to the back that has not grown back

## 2014-10-26 ENCOUNTER — Emergency Department (HOSPITAL_BASED_OUTPATIENT_CLINIC_OR_DEPARTMENT_OTHER)
Admission: EM | Admit: 2014-10-26 | Discharge: 2014-10-26 | Disposition: A | Discharge status: Home / Self Care | Payer: Self-pay | Attending: Emergency Medicine | Admitting: Emergency Medicine

## 2014-10-26 ENCOUNTER — Encounter (HOSPITAL_BASED_OUTPATIENT_CLINIC_OR_DEPARTMENT_OTHER): Payer: Self-pay

## 2014-10-26 DIAGNOSIS — Z3202 Encounter for pregnancy test, result negative: Secondary | ICD-10-CM | POA: Insufficient documentation

## 2014-10-26 DIAGNOSIS — N760 Acute vaginitis: Secondary | ICD-10-CM | POA: Insufficient documentation

## 2014-10-26 DIAGNOSIS — R102 Pelvic and perineal pain: Secondary | ICD-10-CM

## 2014-10-26 DIAGNOSIS — G43909 Migraine, unspecified, not intractable, without status migrainosus: Secondary | ICD-10-CM | POA: Insufficient documentation

## 2014-10-26 DIAGNOSIS — I10 Essential (primary) hypertension: Secondary | ICD-10-CM | POA: Insufficient documentation

## 2014-10-26 DIAGNOSIS — Z79899 Other long term (current) drug therapy: Secondary | ICD-10-CM | POA: Insufficient documentation

## 2014-10-26 DIAGNOSIS — B9689 Other specified bacterial agents as the cause of diseases classified elsewhere: Secondary | ICD-10-CM

## 2014-10-26 LAB — WET PREP, GENITAL
Trich, Wet Prep: NONE SEEN
Yeast Wet Prep HPF POC: NONE SEEN

## 2014-10-26 LAB — URINALYSIS, ROUTINE W REFLEX MICROSCOPIC
BILIRUBIN URINE: NEGATIVE
Glucose, UA: NEGATIVE mg/dL
Hgb urine dipstick: NEGATIVE
Ketones, ur: NEGATIVE mg/dL
LEUKOCYTES UA: NEGATIVE
NITRITE: NEGATIVE
PH: 6.5 (ref 5.0–8.0)
Protein, ur: NEGATIVE mg/dL
SPECIFIC GRAVITY, URINE: 1.027 (ref 1.005–1.030)
UROBILINOGEN UA: 1 mg/dL (ref 0.0–1.0)

## 2014-10-26 LAB — PREGNANCY, URINE: PREG TEST UR: NEGATIVE

## 2014-10-26 MED ORDER — METRONIDAZOLE 500 MG PO TABS: 500.0000 mg | ORAL_TABLET | Freq: Two times a day (BID) | ORAL | Status: AC

## 2014-10-26 MED ORDER — OXYCODONE-ACETAMINOPHEN 5-325 MG PO TABS: 2.0000 | ORAL_TABLET | ORAL | Status: DC | PRN

## 2014-10-26 NOTE — ED Provider Notes (Signed)
CSN: [id number]     Arrival date & time 10/26/14  1034 History   First MD Initiated Contact with Patient 10/26/14 1156     Chief Complaint  Patient presents with  . Abdominal Pain     (Consider location/radiation/quality/duration/timing/severity/associated sxs/prior Treatment) HPI Comments: Patient here complaining of lower abdominal pain times several days similar to her prior ovarian cyst. No vaginal bleeding or discharge. No dysuria or hematuria. No fever or chills. No vomiting or diarrhea noted. Pain characterized as sharp and worse with standing. Using over-the-counter medication without relief.  Patient is a 39 y.o. female presenting with abdominal pain. The history is provided by the patient.  Abdominal Pain   Past Medical History  Diagnosis Date  . Hypertension   . Migraine    Past Surgical History  Procedure Laterality Date  . Tubal ligation     No family history on file. History  Substance Use Topics  . Smoking status: Never Smoker   . Smokeless tobacco: Not on file  . Alcohol Use: No   OB History    No data available     Review of Systems  Gastrointestinal: Positive for abdominal pain.  All other systems reviewed and are negative.     Allergies  Asa  Home Medications   Prior to Admission medications   Medication Sig Start Date End Date Taking? Authorizing Provider  LISINOPRIL-HYDROCHLOROTHIAZIDE PO Take by mouth.    Historical Provider, MD  metoprolol (LOPRESSOR) 100 MG tablet Take 0.5 tablets (50 mg total) by mouth 2 (two) times daily. 11/14/12   Tatyana A Kirichenko, PA-C  metoprolol (LOPRESSOR) 50 MG tablet Take 1 tablet (50 mg total) by mouth 2 (two) times daily. 03/18/13   Gavin Pound. Ghim, MD   BP 159/90 mmHg  Pulse 74  Temp(Src) 98.6 F (37 C) (Oral)  Resp 16  Ht  (1.549 m)  Wt 112 lb (50.803 kg)  BMI 21.17 kg/m2  SpO2 100%  LMP 10/08/2014 Physical Exam  Constitutional: She is oriented to person, place, and time. She appears  well-developed and well-nourished.  Non-toxic appearance. No distress.  HENT:  Head: Normocephalic and atraumatic.  Eyes: Conjunctivae, EOM and lids are normal. Pupils are equal, round, and reactive to light.  Neck: Normal range of motion. Neck supple. No tracheal deviation present. No thyroid mass present.  Cardiovascular: Normal rate, regular rhythm and normal heart sounds.  Exam reveals no gallop.   No murmur heard. Pulmonary/Chest: Effort normal and breath sounds normal. No stridor. No respiratory distress. She has no decreased breath sounds. She has no wheezes. She has no rhonchi. She has no rales.  Abdominal: Soft. Normal appearance and bowel sounds are normal. She exhibits no distension. There is no tenderness. There is no rebound and no CVA tenderness.  Genitourinary: Right adnexum displays tenderness.  Musculoskeletal: Normal range of motion. She exhibits no edema or tenderness.  Neurological: She is alert and oriented to person, place, and time. She has normal strength. No cranial nerve deficit or sensory deficit. GCS eye subscore is 4. GCS verbal subscore is 5. GCS motor subscore is 6.  Skin: Skin is warm and dry. No abrasion and no rash noted.  Psychiatric: She has a normal mood and affect. Her speech is normal and behavior is normal.  Nursing note and vitals reviewed.   ED Course  Procedures (including critical care time) Labs Review Labs Reviewed  URINALYSIS, ROUTINE W REFLEX MICROSCOPIC  PREGNANCY, URINE    Imaging Review No results found.  EKG Interpretation None      MDM   Final diagnoses:  None   Patient to be treated for bacterial vaginosis. No concern for ovarian torsion. We'll see her Gyn Dr.    Toy BakerAnthony T Charie Pinkus, MD 10/26/14 [phone]

## 2014-10-26 NOTE — ED Notes (Signed)
Pt reports lower abdominal pain since Sunday. Reports menstrual period regularly on 1/17. Sts she started bleeding 2/1, with bleeding and cramping.  Sts now she is having lower abdominal pain and cramping with lower back pain. Denies dysuria. Denies discharge.

## 2014-10-26 NOTE — Discharge Instructions (Signed)
Abdominal Pain, Women °Abdominal (stomach, pelvic, or belly) pain can be caused by many things. It is important to tell your doctor: °· The location of the pain. °· Does it come and go or is it present all the time? °· Are there things that start the pain (eating certain foods, exercise)? °· Are there other symptoms associated with the pain (fever, nausea, vomiting, diarrhea)? °All of this is helpful to know when trying to find the cause of the pain. °CAUSES  °· Stomach: virus or bacteria infection, or ulcer. °· Intestine: appendicitis (inflamed appendix), regional ileitis (Crohn's disease), ulcerative colitis (inflamed colon), irritable bowel syndrome, diverticulitis (inflamed diverticulum of the colon), or cancer of the stomach or intestine. °· Gallbladder disease or stones in the gallbladder. °· Kidney disease, kidney stones, or infection. °· Pancreas infection or cancer. °· Fibromyalgia (pain disorder). °· Diseases of the female organs: °· Uterus: fibroid (non-cancerous) tumors or infection. °· Fallopian tubes: infection or tubal pregnancy. °· Ovary: cysts or tumors. °· Pelvic adhesions (scar tissue). °· Endometriosis (uterus lining tissue growing in the pelvis and on the pelvic organs). °· Pelvic congestion syndrome (female organs filling up with blood just before the menstrual period). °· Pain with the menstrual period. °· Pain with ovulation (producing an egg). °· Pain with an IUD (intrauterine device, birth control) in the uterus. °· Cancer of the female organs. °· Functional pain (pain not caused by a disease, may improve without treatment). °· Psychological pain. °· Depression. °DIAGNOSIS  °Your doctor will decide the seriousness of your pain by doing an examination. °· Blood tests. °· X-rays. °· Ultrasound. °· CT scan (computed tomography, special type of X-ray). °· MRI (magnetic resonance imaging). °· Cultures, for infection. °· Barium enema (dye inserted in the large intestine, to better view it with  X-rays). °· Colonoscopy (looking in intestine with a lighted tube). °· Laparoscopy (minor surgery, looking in abdomen with a lighted tube). °· Major abdominal exploratory surgery (looking in abdomen with a large incision). °TREATMENT  °The treatment will depend on the cause of the pain.  °· Many cases can be observed and treated at home. °· Over-the-counter medicines recommended by your caregiver. °· Prescription medicine. °· Antibiotics, for infection. °· Birth control pills, for painful periods or for ovulation pain. °· Hormone treatment, for endometriosis. °· Nerve blocking injections. °· Physical therapy. °· Antidepressants. °· Counseling with a psychologist or psychiatrist. °· Minor or major surgery. °HOME CARE INSTRUCTIONS  °· Do not take laxatives, unless directed by your caregiver. °· Take over-the-counter pain medicine only if ordered by your caregiver. Do not take aspirin because it can cause an upset stomach or bleeding. °· Try a clear liquid diet (broth or water) as ordered by your caregiver. Slowly move to a bland diet, as tolerated, if the pain is related to the stomach or intestine. °· Have a thermometer and take your temperature several times a day, and record it. °· Bed rest and sleep, if it helps the pain. °· Avoid sexual intercourse, if it causes pain. °· Avoid stressful situations. °· Keep your follow-up appointments and tests, as your caregiver orders. °· If the pain does not go away with medicine or surgery, you may try: °· Acupuncture. °· Relaxation exercises (yoga, meditation). °· Group therapy. °· Counseling. °SEEK MEDICAL CARE IF:  °· You notice certain foods cause stomach pain. °· Your home care treatment is not helping your pain. °· You need stronger pain medicine. °· You want your IUD removed. °· You feel faint or   lightheaded. °· You develop nausea and vomiting. °· You develop a rash. °· You are having side effects or an allergy to your medicine. °SEEK IMMEDIATE MEDICAL CARE IF:  °· Your  pain does not go away or gets worse. °· You have a fever. °· Your pain is felt only in portions of the abdomen. The right side could possibly be appendicitis. The left lower portion of the abdomen could be colitis or diverticulitis. °· You are passing blood in your stools (bright red or black tarry stools, with or without vomiting). °· You have blood in your urine. °· You develop chills, with or without a fever. °· You pass out. °MAKE SURE YOU:  °· Understand these instructions. °· Will watch your condition. °· Will get help right away if you are not doing well or get worse. °Document Released: 06/21/2007 Document Revised: 01/08/2014 Document Reviewed: 07/11/2009 °ExitCare® Patient Information ©2015 [employer]. This information is not intended to replace advice given to you by your health care provider. Make sure you discuss any questions you have with your health care provider. °Bacterial Vaginosis °Bacterial vaginosis is a vaginal infection that occurs when the normal balance of bacteria in the vagina is disrupted. It results from an overgrowth of certain bacteria. This is the most common vaginal infection in women of childbearing age. Treatment is important to prevent complications, especially in pregnant women, as it can cause a premature delivery. °CAUSES  °Bacterial vaginosis is caused by an increase in harmful bacteria that are normally present in smaller amounts in the vagina. Several different kinds of bacteria can cause bacterial vaginosis. However, the reason that the condition develops is not fully understood. °RISK FACTORS °Certain activities or behaviors can put you at an increased risk of developing bacterial vaginosis, including: °· Having a new sex partner or multiple sex partners. °· Douching. °· Using an intrauterine device (IUD) for contraception. °Women do not get bacterial vaginosis from toilet seats, bedding, swimming pools, or contact with objects around them. °SIGNS AND SYMPTOMS  °Some  women with bacterial vaginosis have no signs or symptoms. Common symptoms include: °· Grey vaginal discharge. °· A fishlike odor with discharge, especially after sexual intercourse. °· Itching or burning of the vagina and vulva. °· Burning or pain with urination. °DIAGNOSIS  °Your health care provider will take a medical history and examine the vagina for signs of bacterial vaginosis. A sample of vaginal fluid may be taken. Your health care provider will look at this sample under a microscope to check for bacteria and abnormal cells. A vaginal pH test may also be done.  °TREATMENT  °Bacterial vaginosis may be treated with antibiotic medicines. These may be given in the form of a pill or a vaginal cream. A second round of antibiotics may be prescribed if the condition comes back after treatment.  °HOME CARE INSTRUCTIONS  °· Only take over-the-counter or prescription medicines as directed by your health care provider. °· If antibiotic medicine was prescribed, take it as directed. Make sure you finish it even if you start to feel better. °· Do not have sex until treatment is completed. °· Tell all sexual partners that you have a vaginal infection. They should see their health care provider and be treated if they have problems, such as a mild rash or itching. °· Practice safe sex by using condoms and only having one sex partner. °SEEK MEDICAL CARE IF:  °· Your symptoms are not improving after 3 days of treatment. °· You have increased discharge   or pain. °· You have a fever. °MAKE SURE YOU:  °· Understand these instructions. °· Will watch your condition. °· Will get help right away if you are not doing well or get worse. °FOR MORE INFORMATION  °Centers for Disease Control and Prevention, Division of STD Prevention: www.cdc.gov/std °American Sexual Health Association (ASHA): www.ashastd.org  °Document Released: 08/24/2005 Document Revised: 06/14/2013 Document Reviewed: 04/05/2013 °ExitCare® Patient Information ©2015  [employer]. This information is not intended to replace advice given to you by your health care provider. Make sure you discuss any questions you have with your health care provider. ° °

## 2014-10-26 NOTE — ED Notes (Signed)
Patient asked to change into a gown.  

## 2014-10-29 LAB — GC/CHLAMYDIA PROBE AMP (~~LOC~~) NOT AT ARMC
CHLAMYDIA, DNA PROBE: NEGATIVE
NEISSERIA GONORRHEA: NEGATIVE

## 2015-02-13 ENCOUNTER — Encounter (HOSPITAL_BASED_OUTPATIENT_CLINIC_OR_DEPARTMENT_OTHER): Payer: Self-pay | Admitting: *Deleted

## 2015-02-13 DIAGNOSIS — Y939 Activity, unspecified: Secondary | ICD-10-CM | POA: Insufficient documentation

## 2015-02-13 DIAGNOSIS — W57XXXA Bitten or stung by nonvenomous insect and other nonvenomous arthropods, initial encounter: Secondary | ICD-10-CM | POA: Insufficient documentation

## 2015-02-13 DIAGNOSIS — I10 Essential (primary) hypertension: Secondary | ICD-10-CM | POA: Diagnosis not present

## 2015-02-13 DIAGNOSIS — Z792 Long term (current) use of antibiotics: Secondary | ICD-10-CM | POA: Diagnosis not present

## 2015-02-13 DIAGNOSIS — Y929 Unspecified place or not applicable: Secondary | ICD-10-CM | POA: Diagnosis not present

## 2015-02-13 DIAGNOSIS — G43909 Migraine, unspecified, not intractable, without status migrainosus: Secondary | ICD-10-CM | POA: Insufficient documentation

## 2015-02-13 DIAGNOSIS — Z79899 Other long term (current) drug therapy: Secondary | ICD-10-CM | POA: Diagnosis not present

## 2015-02-13 DIAGNOSIS — Y999 Unspecified external cause status: Secondary | ICD-10-CM | POA: Diagnosis not present

## 2015-02-13 DIAGNOSIS — S40862A Insect bite (nonvenomous) of left upper arm, initial encounter: Secondary | ICD-10-CM | POA: Diagnosis present

## 2015-02-13 NOTE — ED Notes (Signed)
Pt has a tick embedded in her left axilla area.

## 2015-02-14 ENCOUNTER — Emergency Department (HOSPITAL_BASED_OUTPATIENT_CLINIC_OR_DEPARTMENT_OTHER)
Admission: EM | Admit: 2015-02-14 | Discharge: 2015-02-14 | Disposition: A | Discharge status: Home / Self Care | Payer: No Typology Code available for payment source | Attending: Emergency Medicine | Admitting: Emergency Medicine

## 2015-02-14 DIAGNOSIS — W57XXXA Bitten or stung by nonvenomous insect and other nonvenomous arthropods, initial encounter: Secondary | ICD-10-CM

## 2015-02-14 NOTE — Discharge Instructions (Signed)
Tick Bite Information Ticks are insects that attach themselves to the skin and draw blood for food. There are various types of ticks. Common types include wood ticks and deer ticks. Most ticks live in shrubs and grassy areas. Ticks can climb onto your body when you make contact with leaves or grass where the tick is waiting. The most common places on the body for ticks to attach themselves are the scalp, neck, armpits, waist, and groin. Most tick bites are harmless, but sometimes ticks carry germs that cause diseases. These germs can be spread to a person during the tick's feeding process. The chance of a disease spreading through a tick bite depends on:   The type of tick.  Time of year.   How long the tick is attached.   Geographic location.  HOW CAN YOU PREVENT TICK BITES? Take these steps to help prevent tick bites when you are outdoors:  Wear protective clothing. Long sleeves and long pants are best.   Wear white clothes so you can see ticks more easily.  Tuck your pant legs into your socks.   If walking on a trail, stay in the middle of the trail to avoid brushing against bushes.  Avoid walking through areas with long grass.  Put insect repellent on all exposed skin and along boot tops, pant legs, and sleeve cuffs.   Check clothing, hair, and skin repeatedly and before going inside.   Brush off any ticks that are not attached.  Take a shower or bath as soon as possible after being outdoors.  WHAT IS THE PROPER WAY TO REMOVE A TICK? Ticks should be removed as soon as possible to help prevent diseases caused by tick bites. 1. If latex gloves are available, put them on before trying to remove a tick.  2. Using fine-point tweezers, grasp the tick as close to the skin as possible. You may also use curved forceps or a tick removal tool. Grasp the tick as close to its head as possible. Avoid grasping the tick on its body. 3. Pull gently with steady upward pressure until  the tick lets go. Do not twist the tick or jerk it suddenly. This may break off the tick's head or mouth parts. 4. Do not squeeze or crush the tick's body. This could force disease-carrying fluids from the tick into your body.  5. After the tick is removed, wash the bite area and your hands with soap and water or other disinfectant such as alcohol. 6. Apply a small amount of antiseptic cream or ointment to the bite site.  7. Wash and disinfect any instruments that were used.  Do not try to remove a tick by applying a hot match, petroleum jelly, or fingernail polish to the tick. These methods do not work and may increase the chances of disease being spread from the tick bite.  WHEN SHOULD YOU SEEK MEDICAL CARE? Contact your health care provider if you are unable to remove a tick from your skin or if a part of the tick breaks off and is stuck in the skin.  After a tick bite, you need to be aware of signs and symptoms that could be related to diseases spread by ticks. Contact your health care provider if you develop any of the following in the days or weeks after the tick bite:  Unexplained fever.  Rash. A circular rash that appears days or weeks after the tick bite may indicate the possibility of Lyme disease. The rash may resemble   a target with a bull's-eye and may occur at a different part of your body than the tick bite.  Redness and swelling in the area of the tick bite.   Tender, swollen lymph glands.   Diarrhea.   Weight loss.   Cough.   Fatigue.   Muscle, joint, or bone pain.   Abdominal pain.   Headache.   Lethargy or a change in your level of consciousness.  Difficulty walking or moving your legs.   Numbness in the legs.   Paralysis.  Shortness of breath.   Confusion.   Repeated vomiting.  Document Released: 08/21/2000 Document Revised: 06/14/2013 Document Reviewed: 02/01/2013 Exit[employer] Patient Information 2015 [employer]. This information is  not intended to replace advice given to you by your health care provider. Make sure you discuss any questions you have with your health care provider.  

## 2015-02-14 NOTE — ED Provider Notes (Signed)
CSN: [id number]     Arrival date & time 02/13/15  2223 History   First MD Initiated Contact with Patient 02/14/15 0014     Chief Complaint  Patient presents with  . Tick Removal     (Consider location/radiation/quality/duration/timing/severity/associated sxs/prior Treatment) HPI  Patient presents with tick attached to the left axilla. Requesting removal. No other associated symptoms. No fevers, erythema, or other rash.  Tick removed by nursing prior to my evaluation.  Past Medical History  Diagnosis Date  . Hypertension   . Migraine    Past Surgical History  Procedure Laterality Date  . Tubal ligation     No family history on file. History  Substance Use Topics  . Smoking status: Never Smoker   . Smokeless tobacco: Not on file  . Alcohol Use: No   OB History    No data available     Review of Systems  Constitutional: Negative for fever.  Skin: Negative for rash.  All other systems reviewed and are negative.     Allergies  Asa  Home Medications   Prior to Admission medications   Medication Sig Start Date End Date Taking? Authorizing Provider  metoprolol (LOPRESSOR) 50 MG tablet Take 1 tablet (50 mg total) by mouth 2 (two) times daily. 03/18/13  Yes Quita Skye, MD  LISINOPRIL-HYDROCHLOROTHIAZIDE PO Take by mouth.    Historical Provider, MD  metoprolol (LOPRESSOR) 100 MG tablet Take 0.5 tablets (50 mg total) by mouth 2 (two) times daily. 11/14/12   Tatyana Kirichenko, PA-C  metroNIDAZOLE (FLAGYL) 500 MG tablet Take 1 tablet (500 mg total) by mouth 2 (two) times daily. 10/26/14   Lorre Nick, MD  oxyCODONE-acetaminophen (PERCOCET/ROXICET) 5-325 MG per tablet Take 2 tablets by mouth every 4 (four) hours as needed for severe pain. 10/26/14   Lorre Nick, MD   BP 150/94 mmHg  Pulse 73  Temp(Src) 99.2 F (37.3 C) (Oral)  Resp 16  Ht 5\' 1"  (1.549 m)  Wt 113 lb (51.256 kg)  BMI 21.36 kg/m2  SpO2 99%  LMP 01/23/2015 Physical Exam  Constitutional: She is  oriented to person, place, and time. She appears well-developed and well-nourished.  HENT:  Head: Normocephalic and atraumatic.  Cardiovascular: Normal rate and regular rhythm.   Pulmonary/Chest: Effort normal. No respiratory distress.  Abdominal: Bowel sounds are normal.  Neurological: She is alert and oriented to person, place, and time.  Skin: Skin is warm and dry.  No evidence of erythema or rash, no induration at the site of the bite  Psychiatric: She has a normal mood and affect.  Nursing note and vitals reviewed.   ED Course  Procedures (including critical care time) Labs Review Labs Reviewed - No data to display  Imaging Review No results found.   EKG Interpretation None      MDM   Final diagnoses:  Tick bite with subsequent removal of tick    Patient presents for tick removal. Tick removed by nursing and evaluated by me. Head intact. No other symptoms or complaints at this time.  After history, exam, and medical workup I feel the patient has been appropriately medically screened and is safe for discharge home. Pertinent diagnoses were discussed with the patient. Patient was given return precautions.   Shon Baton, MD 02/14/15 [phone]

## 2021-03-02 ENCOUNTER — Other Ambulatory Visit: Payer: Self-pay

## 2021-03-02 ENCOUNTER — Emergency Department (HOSPITAL_BASED_OUTPATIENT_CLINIC_OR_DEPARTMENT_OTHER)
Admission: EM | Admit: 2021-03-02 | Discharge: 2021-03-03 | Disposition: A | Discharge status: Home / Self Care | Payer: BLUE CROSS/BLUE SHIELD | Attending: Emergency Medicine | Admitting: Emergency Medicine

## 2021-03-02 ENCOUNTER — Encounter (HOSPITAL_BASED_OUTPATIENT_CLINIC_OR_DEPARTMENT_OTHER): Payer: Self-pay | Admitting: *Deleted

## 2021-03-02 DIAGNOSIS — R0789 Other chest pain: Secondary | ICD-10-CM

## 2021-03-02 DIAGNOSIS — M25512 Pain in left shoulder: Secondary | ICD-10-CM | POA: Insufficient documentation

## 2021-03-02 DIAGNOSIS — Z79899 Other long term (current) drug therapy: Secondary | ICD-10-CM | POA: Diagnosis not present

## 2021-03-02 DIAGNOSIS — R0602 Shortness of breath: Secondary | ICD-10-CM | POA: Insufficient documentation

## 2021-03-02 DIAGNOSIS — I10 Essential (primary) hypertension: Secondary | ICD-10-CM | POA: Insufficient documentation

## 2021-03-02 DIAGNOSIS — R079 Chest pain, unspecified: Secondary | ICD-10-CM | POA: Diagnosis present

## 2021-03-02 LAB — TROPONIN I (HIGH SENSITIVITY): Troponin I (High Sensitivity): 4 ng/L (ref <18)

## 2021-03-02 MED ORDER — KETOROLAC TROMETHAMINE 15 MG/ML IJ SOLN
15.0000 mg | Freq: Once | INTRAMUSCULAR | Status: AC
  Administered 2021-03-02: 15 mg via INTRAVENOUS
  Filled 2021-03-02: qty 1

## 2021-03-02 MED ORDER — LABETALOL HCL 5 MG/ML IV SOLN
10.0000 mg | Freq: Once | INTRAVENOUS | Status: AC
  Administered 2021-03-02: 10 mg via INTRAVENOUS
  Filled 2021-03-02: qty 4

## 2021-03-02 MED ORDER — FENTANYL CITRATE (PF) 100 MCG/2ML IJ SOLN
50.0000 ug | Freq: Once | INTRAMUSCULAR | Status: AC
Start: 2021-03-02 — End: 2021-03-02
  Administered 2021-03-02: 50 ug via INTRAVENOUS
  Filled 2021-03-02: qty 2

## 2021-03-02 MED ORDER — ONDANSETRON HCL 4 MG/2ML IJ SOLN
4.0000 mg | Freq: Once | INTRAMUSCULAR | Status: AC
  Administered 2021-03-02: 4 mg via INTRAVENOUS
  Filled 2021-03-02: qty 2

## 2021-03-02 MED ORDER — MORPHINE SULFATE (PF) 4 MG/ML IV SOLN
4.0000 mg | Freq: Once | INTRAVENOUS | Status: AC
  Administered 2021-03-02: 4 mg via INTRAVENOUS
  Filled 2021-03-02: qty 1

## 2021-03-02 NOTE — ED Triage Notes (Signed)
Pt reports substernal CP and left shoulder pain that started today. Pt went to [facility], left AMA after bloodwork and EKG.

## 2021-03-02 NOTE — ED Triage Notes (Signed)
Upon arrival pt reports she just left [facility] where she had EKG, Labs, and xray done. States she left due to wait

## 2021-03-02 NOTE — ED Notes (Signed)
Pt states pain has not improved. Bp remains high avg 190's systolic.  Pt does take BP med daily, propanolol has been discontinued recently and pt states her BP has been high at home.  Provider notified

## 2021-03-02 NOTE — ED Notes (Signed)
Pt ambulated with friend to restroom

## 2021-03-02 NOTE — ED Provider Notes (Signed)
[facility] EMERGENCY DEPARTMENT Provider Note   CSN: [id number] Arrival date & time: 03/02/21  1950     History Chief Complaint  Patient presents with   Chest Pain    Jacqueline Valencia is a 45 y.o. female.  Patient with history of hypertension presents the emergency department for evaluation of left chest and shoulder pain, starting acutely several hours ago.  Patient was walking at [facility] with her sister when the symptoms started.  Pain is worse with movement of her arm and when you press over the left shoulder.  The pain radiated to her left chest.  She felt short of breath after she presented to [facility] hospital.  There she had an EKG, chest x-ray, lab work but left prior to being seen due to wait times.  She denies history of high cholesterol, diabetes, smoking, family history of cardiac disease at a young age.  Lab work, chest x-ray results reviewed and are reassuring performed at [facility].  First troponin was normal.  Patient denies risk factors for pulmonary embolism including: unilateral leg swelling, history of DVT/PE/other blood clots, use of exogenous hormones, recent immobilizations, recent surgery, recent travel (>4hr segment), malignancy, hemoptysis.  Patient also noted several red areas come up on her arms 2 days ago.  These were itchy at first and were burning yesterday.  She denies bug bites, tick bites, fevers.  She did take Benadryl.  No sx of anaphylaxis.   At [facility]: CMP K 3.4, GGlucose 117 otherwise nml.  CBC normal Troponin 3 Mag nml      Past Medical History:  Diagnosis Date   Hypertension    Migraine     There are no problems to display for this patient.   Past Surgical History:  Procedure Laterality Date   TUBAL LIGATION       OB History   No obstetric history on file.     History reviewed. No pertinent family history.  Social History   Tobacco Use   Smoking status: Never  Substance Use Topics    Alcohol use: No   Drug use: No    Home Medications Prior to Admission medications   Medication Sig Start Date End Date Taking? Authorizing Provider  LISINOPRIL-HYDROCHLOROTHIAZIDE PO Take by mouth.    [provider]  metoprolol (LOPRESSOR) 100 MG tablet Take 0.5 tablets (50 mg total) by mouth 2 (two) times daily. 11/14/12   Kirichenko, Tatyana, PA-C  metoprolol (LOPRESSOR) 50 MG tablet Take 1 tablet (50 mg total) by mouth 2 (two) times daily. 03/18/13   Quita Skye, MD  metroNIDAZOLE (FLAGYL) 500 MG tablet Take 1 tablet (500 mg total) by mouth 2 (two) times daily. 10/26/14   Lorre Nick, MD    Allergies    Jonne Ply [aspirin]  Review of Systems   Review of Systems  Constitutional:  Negative for diaphoresis and fever.  HENT:  Negative for facial swelling and trouble swallowing.   Eyes:  Negative for redness.  Respiratory:  Positive for shortness of breath. Negative for cough, wheezing and stridor.   Cardiovascular:  Positive for chest pain. Negative for palpitations and leg swelling.  Gastrointestinal:  Negative for abdominal pain, nausea and vomiting.  Genitourinary:  Negative for dysuria.  Musculoskeletal:  Positive for myalgias. Negative for back pain and neck pain.  Skin:  Negative for rash.  Neurological:  Negative for syncope and light-headedness.  Psychiatric/Behavioral:  Negative for confusion. The patient is not nervous/anxious.    Physical Exam  Updated Vital Signs BP (!) 186/125 (BP Location: Left Arm)   Pulse 87   Temp 98.8 F (37.1 C) (Oral)   Ht 5\' 1"  (1.549 m)   Wt 57.6 kg   LMP 02/09/2021   SpO2 100%   BMI 24.00 kg/m   Physical Exam Vitals and nursing note reviewed.  Constitutional:      Appearance: She is well-developed. She is not diaphoretic.  HENT:     Head: Normocephalic and atraumatic.     Mouth/Throat:     Mouth: Mucous membranes are not dry.  Eyes:     Conjunctiva/sclera: Conjunctivae normal.  Neck:     Vascular: Normal carotid pulses.  No JVD.     Trachea: Trachea normal. No tracheal deviation.  Cardiovascular:     Rate and Rhythm: Normal rate and regular rhythm.     Pulses: No decreased pulses.          Radial pulses are 2+ on the right side and 2+ on the left side.     Heart sounds: Normal heart sounds, S1 normal and S2 normal. No murmur heard. Pulmonary:     Effort: Pulmonary effort is normal. No respiratory distress.     Breath sounds: No wheezing.  Chest:     Chest wall: Tenderness present.     Comments: Pt with tenderness to palpation over the left lateral chest and anterior left shoulder.  Patient winces when I push over the anterior left shoulder.  She is holding her arm up and pushing on the area because this helps with the pain.  Certain movements makes the pain worse. Abdominal:     General: Bowel sounds are normal.     Palpations: Abdomen is soft.     Tenderness: There is no abdominal tenderness. There is no guarding or rebound.  Musculoskeletal:        General: Normal range of motion.     Cervical back: Normal range of motion and neck supple. No muscular tenderness.  Skin:    General: Skin is warm and dry.     Coloration: Skin is not pale.  Neurological:     Mental Status: She is alert.    ED Results / Procedures / Treatments   Labs (all labs ordered are listed, but only abnormal results are displayed) Labs Reviewed  TROPONIN I (HIGH SENSITIVITY)    EKG EKG Interpretation  Date/Time:  Sunday March 02 2021 20:09:41 EDT Ventricular Rate:  87 PR Interval:  176 QRS Duration: 88 QT Interval:  388 QTC Calculation: 466 R Axis:   71 Text Interpretation: Normal sinus rhythm Minimal voltage criteria for LVH, may be normal variant ( Sokolow-Lyon ) Borderline ECG No significant change since last tracing Confirmed by [id number] [phone]) on 03/02/2021 8:37:25 PM  Radiology No results found.  Procedures Procedures   Medications Ordered in ED Medications  ketorolac (TORADOL) 15 MG/ML injection 15 mg  (has no administration in time range)  fentaNYL (SUBLIMAZE) injection 50 mcg (has no administration in time range)  ondansetron (ZOFRAN) injection 4 mg (has no administration in time range)    ED Course  I have reviewed the triage vital signs and the nursing notes.  Pertinent labs & imaging results that were available during my care of the patient were reviewed by me and considered in my medical decision making (see chart for details).  Patient seen and examined. Work-up initiated. Medications ordered.  I do not feel that we need to repeat her chest x-ray or other labs.  Will check second troponin which is now due.  Pain is atypical for ACS or PE.  Seems more musculoskeletal in nature to me at this time.  Vital signs reviewed and are as follows: BP (!) 186/125 (BP Location: Left Arm)   Pulse 87   Temp 98.8 F (37.1 C) (Oral)   Ht 5\' 1"  (1.549 m)   Wt 57.6 kg   LMP 02/09/2021   SpO2 100%   BMI 24.00 kg/m   Pt did not get relief with initial dose of pain medication -- morphine, labetalol ordered.   11:46 PM After morphine, pt reports more pressure in left chest. This is worsened with palpation of the chest wall. BP 184/124.   11:52 PM Discussed with Dr. 04/11/2021. Will obtain dissection study and reassess.     MDM Rules/Calculators/A&P                          Pending completion of work-up.    Final Clinical Impression(s) / ED Diagnoses Final diagnoses:  None    Rx / DC Orders ED Discharge Orders     None        Nicanor Alcon, PA-C 03/02/21 2353    03/04/21, DO 03/05/21 1600

## 2021-03-03 ENCOUNTER — Emergency Department (HOSPITAL_BASED_OUTPATIENT_CLINIC_OR_DEPARTMENT_OTHER): Payer: BLUE CROSS/BLUE SHIELD

## 2021-03-03 DIAGNOSIS — R079 Chest pain, unspecified: Secondary | ICD-10-CM | POA: Diagnosis not present

## 2021-03-03 LAB — CBC WITH DIFFERENTIAL/PLATELET
Abs Immature Granulocytes: 0.01 10*3/uL (ref 0.00–0.07)
Basophils Absolute: 0 10*3/uL (ref 0.0–0.1)
Basophils Relative: 1 %
Eosinophils Absolute: 0.1 10*3/uL (ref 0.0–0.5)
Eosinophils Relative: 2 %
HCT: 35.7 % — ABNORMAL LOW (ref 36.0–46.0)
Hemoglobin: 12.7 g/dL (ref 12.0–15.0)
Immature Granulocytes: 0 %
Lymphocytes Relative: 42 %
Lymphs Abs: 2.4 10*3/uL (ref 0.7–4.0)
MCH: 28 pg (ref 26.0–34.0)
MCHC: 35.6 g/dL (ref 30.0–36.0)
MCV: 78.6 fL — ABNORMAL LOW (ref 80.0–100.0)
Monocytes Absolute: 0.7 10*3/uL (ref 0.1–1.0)
Monocytes Relative: 13 %
Neutro Abs: 2.3 10*3/uL (ref 1.7–7.7)
Neutrophils Relative %: 42 %
Platelets: 290 10*3/uL (ref 150–400)
RBC: 4.54 MIL/uL (ref 3.87–5.11)
RDW: 14.6 % (ref 11.5–15.5)
WBC: 5.6 10*3/uL (ref 4.0–10.5)
nRBC: 0 % (ref 0.0–0.2)

## 2021-03-03 LAB — BASIC METABOLIC PANEL
Anion gap: 9 (ref 5–15)
BUN: 12 mg/dL (ref 6–20)
CO2: 25 mmol/L (ref 22–32)
Calcium: 8.5 mg/dL — ABNORMAL LOW (ref 8.9–10.3)
Chloride: 100 mmol/L (ref 98–111)
Creatinine, Ser: 0.74 mg/dL (ref 0.44–1.00)
GFR, Estimated: >60 mL/min (ref >60)
Glucose, Bld: 133 mg/dL — ABNORMAL HIGH (ref 70–99)
Potassium: 3.2 mmol/L — ABNORMAL LOW (ref 3.5–5.1)
Sodium: 134 mmol/L — ABNORMAL LOW (ref 135–145)

## 2021-03-03 MED ORDER — LIDOCAINE 5 % EX PTCH: 1.0000 | MEDICATED_PATCH | CUTANEOUS | 0 refills | Status: AC

## 2021-03-03 MED ORDER — IOHEXOL 350 MG/ML SOLN
100.0000 mL | Freq: Once | INTRAVENOUS | Status: AC | PRN
  Administered 2021-03-03: 100 mL via INTRAVENOUS

## 2021-03-03 MED ORDER — AMLODIPINE BESYLATE 5 MG PO TABS: 5.0000 mg | ORAL_TABLET | Freq: Every day | ORAL | 0 refills | Status: AC

## 2022-10-22 IMAGING — CT CT ANGIO CHEST-ABD-PELV FOR DISSECTION W/ AND WO/W CM
2 of 9 series · 15 of 46 positions shown, 17 images · IV contrast (Omnipaque)
Comparison: 05/10/2020

CLINICAL DATA: Chest or back pain, aortic dissection suspected.
Hypertension.

EXAM:
CT ANGIOGRAPHY CHEST, ABDOMEN AND PELVIS
TECHNIQUE: 05/10/2020

[Series 5: axial arterial · axial · arterial · 0.70mm/px · z∈[-522,-30]mm · 12 of 190 slices shown, 14 images]
[im 13/190  soft-tissue]
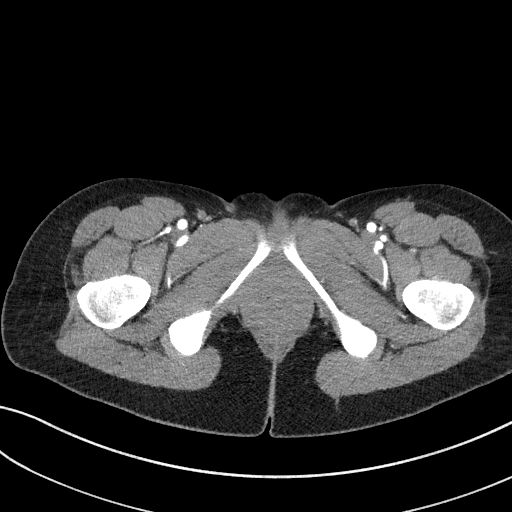
[im 13/190  bone]
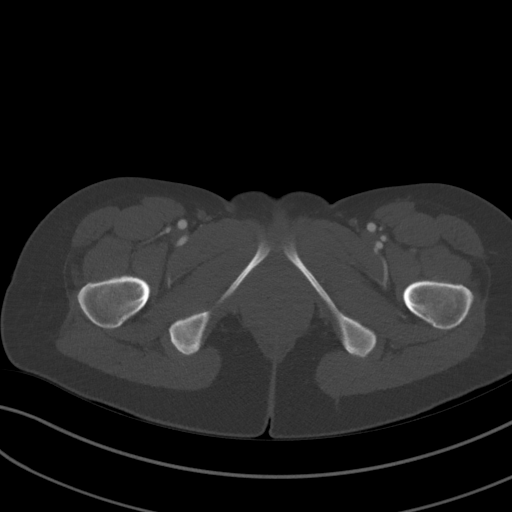
[im 26/190  soft-tissue]
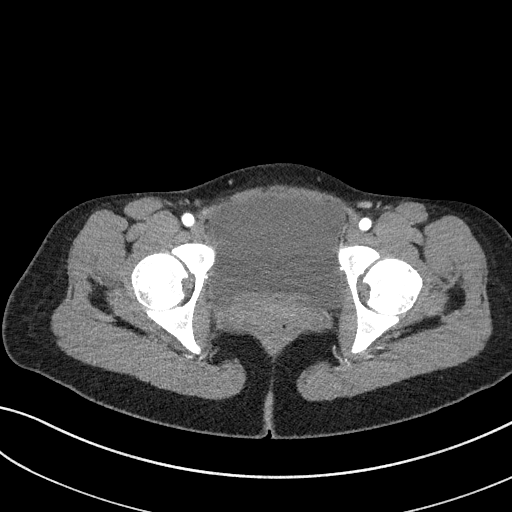
[im 38/190  soft-tissue]
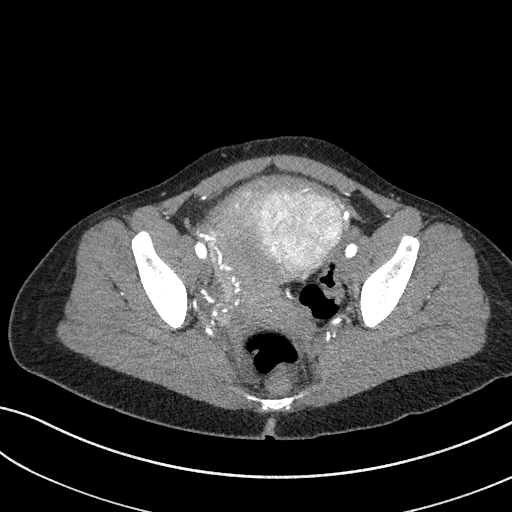
[im 64/190  soft-tissue]
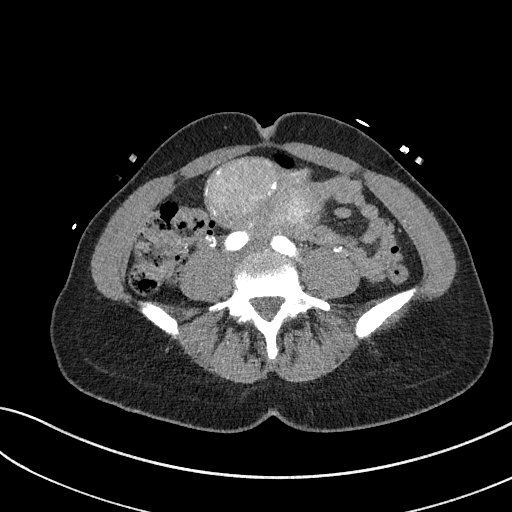
[im 76/190  soft-tissue]
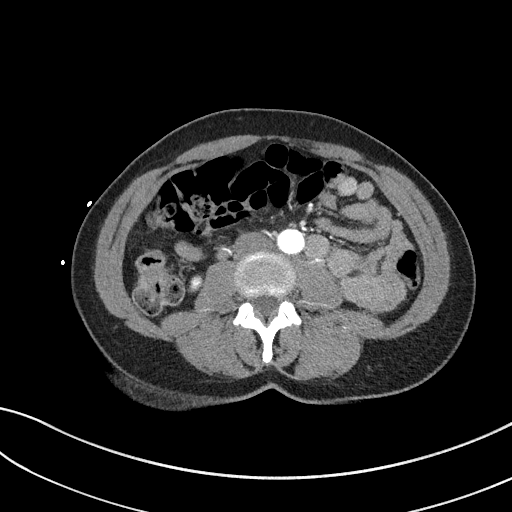
[im 89/190  soft-tissue]
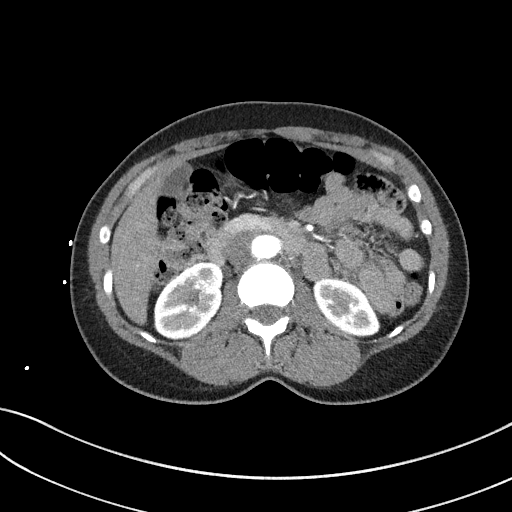
[im 101/190  soft-tissue]
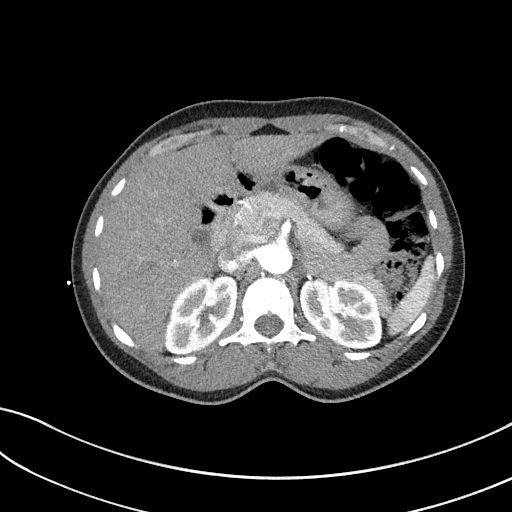
[im 114/190  soft-tissue]
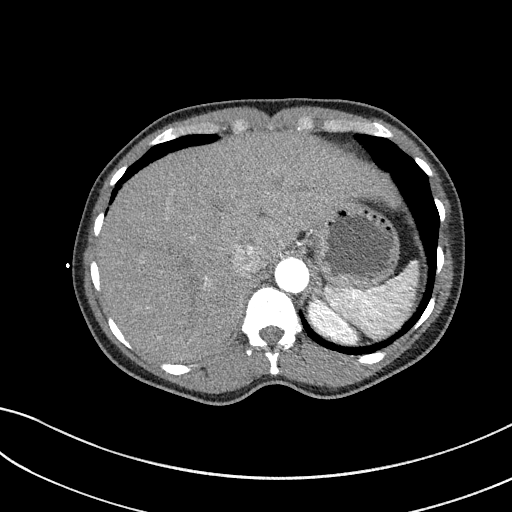
[im 127/190  soft-tissue]
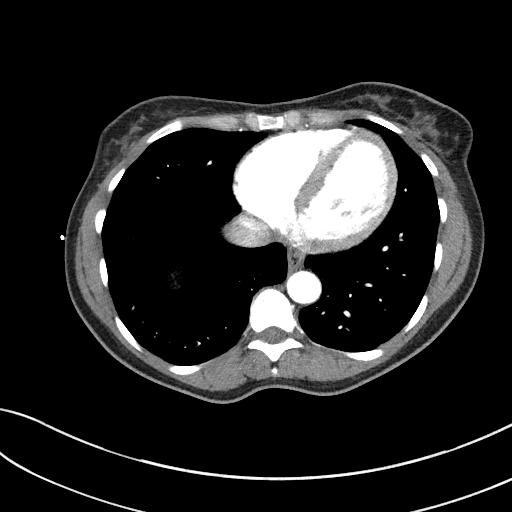
[im 127/190  bone]
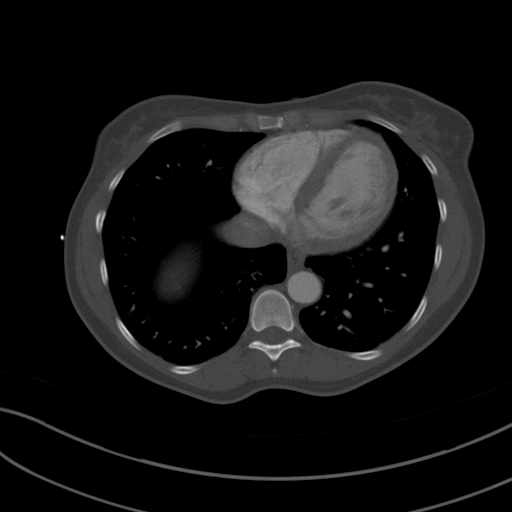
[im 152/190  soft-tissue]
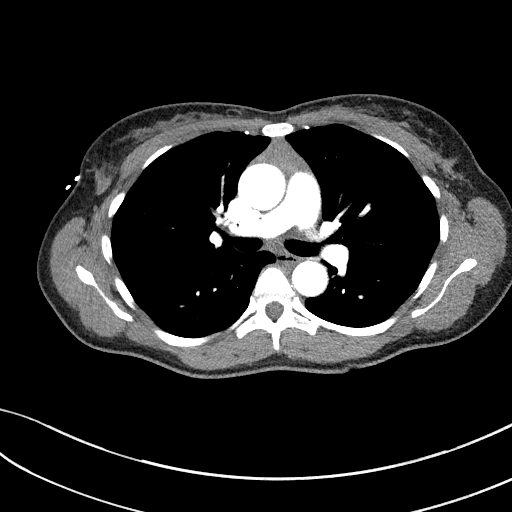
[im 164/190  soft-tissue]
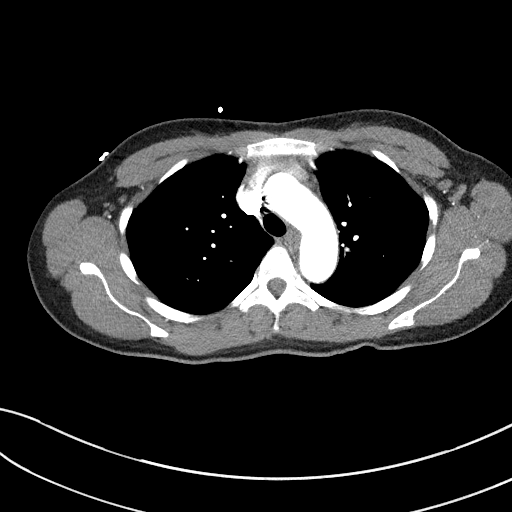
[im 177/190  soft-tissue]
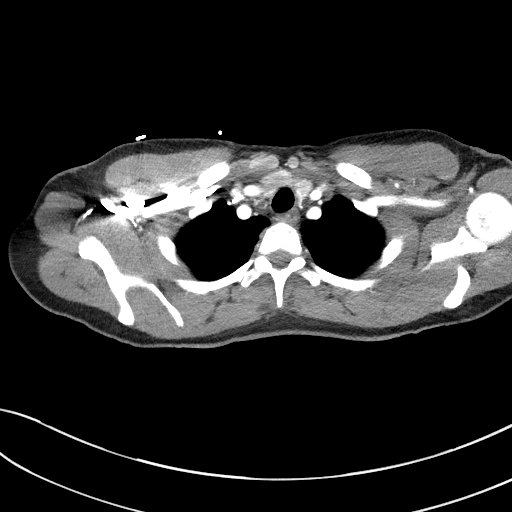

[Series 8: coronals · coronal · 0.76mm/px · 3 of 142 slices shown]
[im 36/142  soft-tissue]
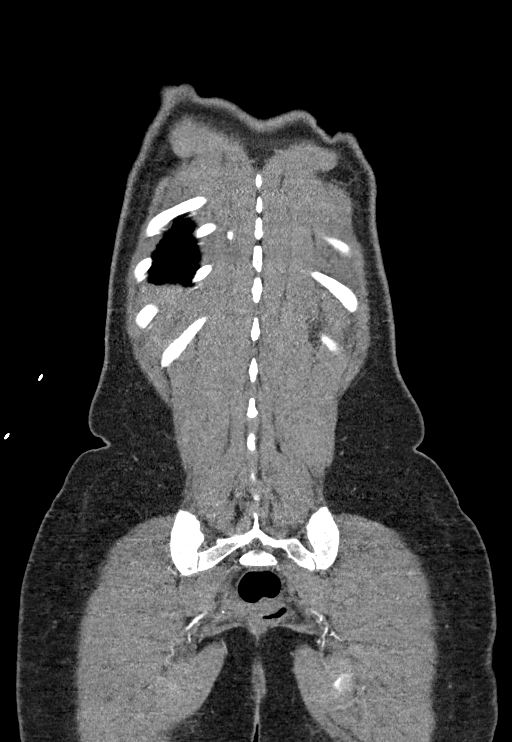
[im 71/142  soft-tissue]
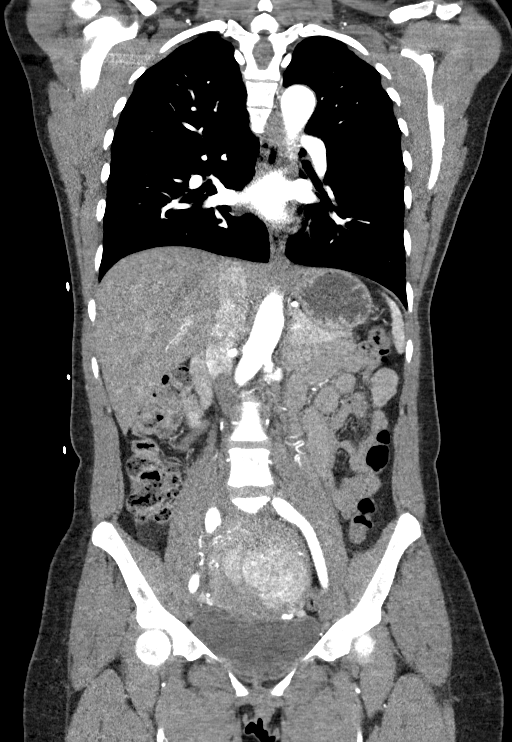
[im 106/142  soft-tissue]
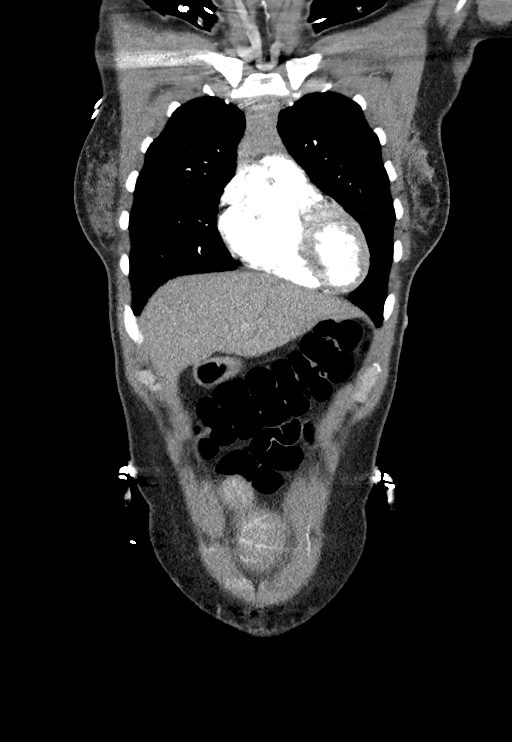

[15 of 46 positions shown; findings below may reference images not displayed]

Multidetector CT imaging through the chest, abdomen and pelvis was
performed using the standard protocol during bolus administration of
intravenous contrast. Multiplanar reconstructed images and MIPs were
obtained and reviewed to evaluate the vascular anatomy.

CONTRAST:  100mL OMNIPAQUE IOHEXOL 350 MG/ML SOLN
FINDINGS: CTA CHEST FINDINGS

Cardiovascular: Heart is normal size. Aorta is normal caliber. No
dissection. No filling defects in the pulmonary arteries to suggest
pulmonary emboli.

Mediastinum/Nodes: No mediastinal, hilar, or axillary adenopathy.
Trachea and esophagus are unremarkable. Thyroid unremarkable.

Lungs/Pleura: Lungs are clear. No focal airspace opacities or
suspicious nodules. No effusions.

Musculoskeletal: Chest wall soft tissues are unremarkable. No acute
bony abnormality.

Review of the MIP images confirms the above findings.

CTA ABDOMEN AND PELVIS FINDINGS

VASCULAR

Aorta: Normal caliber aorta without aneurysm, dissection, vasculitis
or significant stenosis.

Celiac: Patent without evidence of aneurysm, dissection, vasculitis
or significant stenosis.

SMA: Patent without evidence of aneurysm, dissection, vasculitis or
significant stenosis.

Renals: Both renal arteries are patent without evidence of aneurysm,
dissection, vasculitis, fibromuscular dysplasia or significant
stenosis.

IMA: Patent without evidence of aneurysm, dissection, vasculitis or
significant stenosis.

Inflow: Patent without evidence of aneurysm, dissection, vasculitis
or significant stenosis.

Veins: No obvious venous abnormality within the limitations of this
arterial phase study.

Review of the MIP images confirms the above findings.

NON-VASCULAR

Hepatobiliary: No focal hepatic abnormality. Gallbladder
unremarkable.

Pancreas: No focal abnormality or ductal dilatation.

Spleen: No focal abnormality.  Normal size.

Adrenals/Urinary Tract: No adrenal abnormality. No focal renal
abnormality. No stones or hydronephrosis. Urinary bladder is
unremarkable.

Stomach/Bowel: Stomach, large and small bowel grossly unremarkable.

Lymphatic: No adenopathy

Reproductive: Enlarged fibroid uterus. Largest fibroid approximately
8 cm. No adnexal mass.

Other: No free fluid or free air.

Musculoskeletal: No acute bony abnormality.

Review of the MIP images confirms the above findings.
IMPRESSION: No evidence of aortic aneurysm or dissection. No evidence of
pulmonary embolus.

No acute findings in the chest, abdomen or pelvis.

Enlarged fibroid uterus.

## 2023-10-05 ENCOUNTER — Encounter (HOSPITAL_BASED_OUTPATIENT_CLINIC_OR_DEPARTMENT_OTHER): Payer: Self-pay | Admitting: Emergency Medicine

## 2023-10-05 ENCOUNTER — Emergency Department (HOSPITAL_BASED_OUTPATIENT_CLINIC_OR_DEPARTMENT_OTHER)
Admission: EM | Admit: 2023-10-05 | Discharge: 2023-10-05 | Discharge status: Against Medical Advice | Payer: No Typology Code available for payment source | Attending: Emergency Medicine | Admitting: Emergency Medicine

## 2023-10-05 ENCOUNTER — Other Ambulatory Visit: Payer: Self-pay

## 2023-10-05 DIAGNOSIS — R059 Cough, unspecified: Secondary | ICD-10-CM | POA: Insufficient documentation

## 2023-10-05 DIAGNOSIS — R0981 Nasal congestion: Secondary | ICD-10-CM | POA: Diagnosis not present

## 2023-10-05 DIAGNOSIS — R519 Headache, unspecified: Secondary | ICD-10-CM | POA: Insufficient documentation

## 2023-10-05 DIAGNOSIS — M791 Myalgia, unspecified site: Secondary | ICD-10-CM | POA: Insufficient documentation

## 2023-10-05 DIAGNOSIS — Z5321 Procedure and treatment not carried out due to patient leaving prior to being seen by health care provider: Secondary | ICD-10-CM | POA: Diagnosis not present

## 2023-10-05 NOTE — ED Notes (Signed)
Pt informed registration that she is leaving department

## 2023-10-05 NOTE — ED Triage Notes (Signed)
C/o headache, body aches, sinus congestion, and coughing since Friday. Tested positive for Flu A+ on Saturday. States symptoms have not gotten better.
